# Patient Record
Sex: Male | Born: 1980
Health system: Southern US, Community
[De-identification: ages and names within clinical notes are randomized; demographics above are authoritative.]

## PROBLEM LIST (undated history)

## (undated) DIAGNOSIS — I509 Heart failure, unspecified: Secondary | ICD-10-CM

## (undated) DIAGNOSIS — A0472 Enterocolitis due to Clostridium difficile, not specified as recurrent: Secondary | ICD-10-CM

## (undated) DIAGNOSIS — Q203 Discordant ventriculoarterial connection: Secondary | ICD-10-CM

## (undated) DIAGNOSIS — I10 Essential (primary) hypertension: Secondary | ICD-10-CM

## (undated) DIAGNOSIS — I209 Angina pectoris, unspecified: Secondary | ICD-10-CM

## (undated) HISTORY — DX: Essential (primary) hypertension: I10

## (undated) HISTORY — PX: CORONARY ARTERY BYPASS GRAFT: SHX141

## (undated) HISTORY — PX: IMPLANTABLE CARDIOVERTER DEFIBRILLATOR IMPLANT: SHX5860

---

## 2002-03-19 ENCOUNTER — Encounter: Payer: Self-pay | Admitting: Family Medicine

## 2002-03-19 ENCOUNTER — Ambulatory Visit (HOSPITAL_COMMUNITY): Admission: RE | Admit: 2002-03-19 | Discharge: 2002-03-19 | Payer: Self-pay | Admitting: Family Medicine

## 2002-04-04 ENCOUNTER — Encounter: Payer: Self-pay | Admitting: Emergency Medicine

## 2002-04-04 ENCOUNTER — Emergency Department (HOSPITAL_COMMUNITY): Admission: EM | Admit: 2002-04-04 | Discharge: 2002-04-04 | Payer: Self-pay | Admitting: Emergency Medicine

## 2002-04-28 ENCOUNTER — Ambulatory Visit (HOSPITAL_COMMUNITY): Admission: RE | Admit: 2002-04-28 | Discharge: 2002-04-28 | Payer: Self-pay | Admitting: Family Medicine

## 2007-10-16 ENCOUNTER — Ambulatory Visit (HOSPITAL_COMMUNITY): Admission: RE | Admit: 2007-10-16 | Discharge: 2007-10-16 | Payer: Self-pay | Admitting: Surgery

## 2008-10-07 ENCOUNTER — Ambulatory Visit (HOSPITAL_COMMUNITY): Admission: RE | Admit: 2008-10-07 | Discharge: 2008-10-07 | Payer: Self-pay | Admitting: Family Medicine

## 2010-12-27 NOTE — Op Note (Signed)
NAME:  Jeremy Goodman, Jeremy Goodman                 ACCOUNT NO.:  [id number]   MEDICAL RECORD NO.:  [id number]          PATIENT TYPE:  AMB   LOCATION:  DAY                          FACILITY:  [facility]   PHYSICIAN:  Thomas A. Cornett, M.D.DATE OF BIRTH:  1980-10-01   DATE OF PROCEDURE:  10/16/2007  DATE OF DISCHARGE:                               OPERATIVE REPORT   PREOPERATIVE DIAGNOSIS:  Large right inguinal scrotal hernia.   POSTOPERATIVE DIAGNOSIS:  Large right inguinal scrotal hernia.   PROCEDURE:  Repair of large right inguinal scrotal hernia with mesh.   SURGEON:  Harriette Bouillon, M.D.   ASSISTANT:  OR staff.   ESTIMATED BLOOD LOSS:  About 30 mL.   DRAINS:  None.   SPECIMENS:  None.   INDICATIONS FOR PROCEDURE:  The patient is a 30 year old male who has a  history of complex cardiac congenital disease.  He has developed a right  inguinal scrotal hernia which was quite large and he wishes repaired due  to increasing symptoms.  He presents today for repair of this.  Risks  were discussed as well as alternative therapies.   DESCRIPTION OF PROCEDURE:  The patient brought to the operating room and  placed spine.  After induction of general anesthesia, the right inguinal  region was prepped and draped in sterile fashion.  Infiltration of local  anesthesia in the right inguinal crease was done and then a oblique  incision was made.  He was extremely thick with adipose tissue in this  area.  We dissected down initially and opened the external oblique.  It  was very difficult to see and initially we opened what appeared to be  the cord structures and hernia sac.  I was able to dissect the hernia  sac away from the cord structures but the external oblique was very  attenuated and very weak in this area.  It was a very difficult  dissection.  Once I was able to get around the cord structures, I was  able to dissect the very large indirect sac off the cord structures.  This extended all the way down  in the scrotum and we actually had to  pull the testicle up into the field to get the sac off of it.  He had  also a small hydrocele that I just opened and then removed all of the  sac that was overlying the testicle.  I was then able to dissect the  remainder of the sac all the way back to the internal ring once I did  that.  I then had to extend my incision on the external oblique more  superiorly to have adequate space to lay mesh.  Of note, the external  oblique was very attenuated due this longstanding chronic large hernia.  I elected to divide the ilioinguinal nerve due to the fact that it was  going to be lying adjacent to the mesh.  I was careful to identify the  iliohypogastric nerves and try to get these out of our way so we could  place stitches into the internal oblique and conjoined tendon  which  appeared to be intact.  Once I got the entire sac dissected off, I  reduced it back in the preperitoneal space.  I initially used a large  Prolene hernia system polypropylene mesh.  This did not lay very well,  was not big enough so I removed it and exchanged it for extended Prolene  hernia system.  The inner leaflet was placed in the prepared space by  the cord where the sac was reduced.  The onlay was placed around the  cord, the slit cut for the cord structures to exit it.  I secured it to  the pubic tubercle with a 0-0 Vicryl.  Also placed a 0-0 Vicryl to the  connecting piece so it was connected over to the conjoined tendon.  We  then closed the mesh around the cord with the #1 Novofil.  I put  interrupted Novofils around the mesh to secure it to the shelving edge  of the inguinal ligament as well as the conjoined tendon.  I put a  couple stitches superiorly with care taken not to get anywhere near the  ilioinguinal nerve remnant.  This helped to lay the mesh flatter.  I  felt it was made a better repair.  I then opened the new internal ring  that the mesh created around the  cord, so the tip my fifth digit could  freely fit through there easily and the cord actually moved to-and-fro  and I did not feel there was any impingement upon the cord.  Irrigation  was used and suctioned out.  The mesh laid up against the  iliohypogastric nerves, but these appeared to be intact with no evidence  of the nerve being trapped around the mesh though.  I did not want to  divide these.  The superficial femoral nerves were stretched quite a bit  during the case it looked like, but it did not appear these were divided  but there was significant stress in trying  to dissect the sac away from  these structures.  Irrigation was used and suctioned out.  I closed the  external oblique aponeurosis with a running 2-0 Vicryl.  I then used 3-0  Vicryl to reapproximate Scarpa's fascia.  4-0 Monocryl was used to close  the skin.  Dermabond was used as a dressing.  All final counts of  sponge, needle and instruments were found be correct this portion of the  case.  The patient was then extubated, taken to recovery in satisfactory  condition.      Thomas A. Cornett, M.D.  Electronically Signed     TAC/MEDQ  D:  10/16/2007  T:  10/16/2007  Job:  [id number]   cc:   Dr. Oswald Hillock, [facility]   Laverle Hobby, M.D.  [address]  [place], [place] [address]

## 2011-05-05 LAB — CBC
HCT: 45.2
Hemoglobin: 15.9
MCHC: 35.1
MCV: 91.8
Platelets: 311
RBC: 4.92
RDW: 12.7
WBC: 12.5 — ABNORMAL HIGH

## 2011-05-05 LAB — BASIC METABOLIC PANEL
BUN: 25 — ABNORMAL HIGH
CO2: 26
Calcium: 9.7
Chloride: 102
Creatinine, Ser: 1.1
GFR calc Af Amer: >60
GFR calc non Af Amer: >60
Glucose, Bld: 103 — ABNORMAL HIGH
Potassium: 4
Sodium: 137

## 2011-05-05 LAB — DIFFERENTIAL
Basophils Relative: 0
Eosinophils Absolute: 0.1
Monocytes Absolute: 0.9
Monocytes Relative: 7

## 2011-05-15 DIAGNOSIS — Z9889 Other specified postprocedural states: Secondary | ICD-10-CM | POA: Insufficient documentation

## 2011-05-15 DIAGNOSIS — Q203 Discordant ventriculoarterial connection: Secondary | ICD-10-CM | POA: Insufficient documentation

## 2011-09-22 DIAGNOSIS — J069 Acute upper respiratory infection, unspecified: Secondary | ICD-10-CM | POA: Diagnosis not present

## 2012-06-20 DIAGNOSIS — Z23 Encounter for immunization: Secondary | ICD-10-CM | POA: Diagnosis not present

## 2012-07-19 DIAGNOSIS — J069 Acute upper respiratory infection, unspecified: Secondary | ICD-10-CM | POA: Diagnosis not present

## 2012-08-22 DIAGNOSIS — R197 Diarrhea, unspecified: Secondary | ICD-10-CM | POA: Diagnosis not present

## 2012-09-02 ENCOUNTER — Encounter (HOSPITAL_COMMUNITY): Payer: Self-pay | Admitting: *Deleted

## 2012-09-02 ENCOUNTER — Inpatient Hospital Stay (HOSPITAL_COMMUNITY)
Admission: EM | Admit: 2012-09-02 | Discharge: 2012-09-03 | DRG: 308 | Disposition: A | Discharge status: Other Acute Inpatient Hospital | Payer: Medicare Other | Source: Other Acute Inpatient Hospital | Attending: Cardiology | Admitting: Cardiology

## 2012-09-02 ENCOUNTER — Other Ambulatory Visit: Payer: Self-pay | Admitting: Physician Assistant

## 2012-09-02 DIAGNOSIS — R0989 Other specified symptoms and signs involving the circulatory and respiratory systems: Secondary | ICD-10-CM | POA: Diagnosis not present

## 2012-09-02 DIAGNOSIS — Q248 Other specified congenital malformations of heart: Secondary | ICD-10-CM

## 2012-09-02 DIAGNOSIS — J90 Pleural effusion, not elsewhere classified: Secondary | ICD-10-CM | POA: Diagnosis not present

## 2012-09-02 DIAGNOSIS — R61 Generalized hyperhidrosis: Secondary | ICD-10-CM | POA: Diagnosis not present

## 2012-09-02 DIAGNOSIS — R570 Cardiogenic shock: Secondary | ICD-10-CM

## 2012-09-02 DIAGNOSIS — I471 Supraventricular tachycardia, unspecified: Secondary | ICD-10-CM

## 2012-09-02 DIAGNOSIS — I509 Heart failure, unspecified: Secondary | ICD-10-CM | POA: Diagnosis present

## 2012-09-02 DIAGNOSIS — J9600 Acute respiratory failure, unspecified whether with hypoxia or hypercapnia: Secondary | ICD-10-CM | POA: Diagnosis present

## 2012-09-02 DIAGNOSIS — N179 Acute kidney failure, unspecified: Secondary | ICD-10-CM | POA: Diagnosis not present

## 2012-09-02 DIAGNOSIS — Z9861 Coronary angioplasty status: Secondary | ICD-10-CM | POA: Diagnosis not present

## 2012-09-02 DIAGNOSIS — J189 Pneumonia, unspecified organism: Secondary | ICD-10-CM | POA: Diagnosis not present

## 2012-09-02 DIAGNOSIS — R109 Unspecified abdominal pain: Secondary | ICD-10-CM | POA: Diagnosis not present

## 2012-09-02 DIAGNOSIS — Z79899 Other long term (current) drug therapy: Secondary | ICD-10-CM | POA: Diagnosis not present

## 2012-09-02 DIAGNOSIS — G9340 Encephalopathy, unspecified: Secondary | ICD-10-CM

## 2012-09-02 DIAGNOSIS — I4892 Unspecified atrial flutter: Principal | ICD-10-CM | POA: Diagnosis present

## 2012-09-02 DIAGNOSIS — R42 Dizziness and giddiness: Secondary | ICD-10-CM | POA: Diagnosis not present

## 2012-09-02 DIAGNOSIS — Z9889 Other specified postprocedural states: Secondary | ICD-10-CM | POA: Diagnosis not present

## 2012-09-02 DIAGNOSIS — R002 Palpitations: Secondary | ICD-10-CM | POA: Diagnosis not present

## 2012-09-02 DIAGNOSIS — R0902 Hypoxemia: Secondary | ICD-10-CM | POA: Diagnosis not present

## 2012-09-02 DIAGNOSIS — Z7682 Awaiting organ transplant status: Secondary | ICD-10-CM | POA: Diagnosis not present

## 2012-09-02 DIAGNOSIS — I5023 Acute on chronic systolic (congestive) heart failure: Secondary | ICD-10-CM | POA: Diagnosis not present

## 2012-09-02 DIAGNOSIS — I498 Other specified cardiac arrhythmias: Secondary | ICD-10-CM | POA: Diagnosis not present

## 2012-09-02 DIAGNOSIS — J810 Acute pulmonary edema: Secondary | ICD-10-CM | POA: Diagnosis not present

## 2012-09-02 DIAGNOSIS — J811 Chronic pulmonary edema: Secondary | ICD-10-CM | POA: Diagnosis present

## 2012-09-02 DIAGNOSIS — Q205 Discordant atrioventricular connection: Secondary | ICD-10-CM | POA: Diagnosis not present

## 2012-09-02 DIAGNOSIS — G9349 Other encephalopathy: Secondary | ICD-10-CM | POA: Diagnosis not present

## 2012-09-02 DIAGNOSIS — Z7982 Long term (current) use of aspirin: Secondary | ICD-10-CM | POA: Diagnosis not present

## 2012-09-02 DIAGNOSIS — I4891 Unspecified atrial fibrillation: Secondary | ICD-10-CM | POA: Diagnosis not present

## 2012-09-02 DIAGNOSIS — R5381 Other malaise: Secondary | ICD-10-CM | POA: Diagnosis not present

## 2012-09-02 DIAGNOSIS — Z4682 Encounter for fitting and adjustment of non-vascular catheter: Secondary | ICD-10-CM | POA: Diagnosis not present

## 2012-09-02 DIAGNOSIS — Q203 Discordant ventriculoarterial connection: Secondary | ICD-10-CM | POA: Diagnosis not present

## 2012-09-02 DIAGNOSIS — I959 Hypotension, unspecified: Secondary | ICD-10-CM | POA: Diagnosis not present

## 2012-09-02 HISTORY — DX: Heart failure, unspecified: I50.9

## 2012-09-02 HISTORY — DX: Angina pectoris, unspecified: I20.9

## 2012-09-02 HISTORY — DX: Enterocolitis due to Clostridium difficile, not specified as recurrent: A04.72

## 2012-09-02 HISTORY — DX: Discordant ventriculoarterial connection: Q20.3

## 2012-09-02 LAB — CBC
Hemoglobin: 15 g/dL (ref 13.0–17.0)
MCH: 31.4 pg (ref 26.0–34.0)
MCHC: 33.8 g/dL (ref 30.0–36.0)
Platelets: 209 10*3/uL (ref 150–400)
RBC: 4.78 MIL/uL (ref 4.22–5.81)

## 2012-09-02 LAB — CREATININE, SERUM
Creatinine, Ser: 0.95 mg/dL (ref 0.50–1.35)
GFR calc non Af Amer: >90 mL/min (ref >90)

## 2012-09-02 MED ORDER — ONDANSETRON HCL 4 MG/2ML IJ SOLN: 4.0000 mg | Freq: Four times a day (QID) | INTRAMUSCULAR | Status: DC | PRN

## 2012-09-02 MED ORDER — ENOXAPARIN SODIUM 40 MG/0.4ML ~~LOC~~ SOLN
40.0000 mg | SUBCUTANEOUS | Status: DC
  Administered 2012-09-02: 40 mg via SUBCUTANEOUS
  Filled 2012-09-02 (×2): qty 0.4

## 2012-09-02 MED ORDER — SODIUM CHLORIDE 0.9 % IV SOLN
INTRAVENOUS | Status: DC
  Administered 2012-09-02 (×2): via INTRAVENOUS

## 2012-09-02 MED ORDER — AMIODARONE HCL IN DEXTROSE 360-4.14 MG/200ML-% IV SOLN
60.0000 mg/h | INTRAVENOUS | Status: DC
  Administered 2012-09-02 – 2012-09-03 (×3): 60 mg/h via INTRAVENOUS
  Filled 2012-09-02 (×8): qty 200

## 2012-09-02 MED ORDER — ASPIRIN EC 81 MG PO TBEC
81.0000 mg | DELAYED_RELEASE_TABLET | Freq: Every day | ORAL | Status: DC
  Administered 2012-09-03: 81 mg via ORAL
  Filled 2012-09-02: qty 1

## 2012-09-02 MED ORDER — ACETAMINOPHEN 325 MG PO TABS: 650.0000 mg | ORAL_TABLET | ORAL | Status: DC | PRN

## 2012-09-02 MED ORDER — NITROGLYCERIN 0.4 MG SL SUBL: 0.4000 mg | SUBLINGUAL_TABLET | SUBLINGUAL | Status: DC | PRN

## 2012-09-02 NOTE — H&P (Signed)
NAME:  Jeremy Goodman, Jeremy Goodman ROOM:   UNIT NUMBER:  [id number] LOCATION: ER ADM/VISIT DATE:  09/02/2012   ADM Jeremy Goodman:  [id number] DOB: 09-08-1980   PRIMARY CARDIOLOGIST:  Dr. Oswald Hillock, [facility].  REFERRING PHYSICIAN:  Dr. Sharee Pimple, [facility] ED.  REASON FOR CONSULTATION:  SVT.  HISTORY OF PRESENT ILLNESS:  Jeremy Goodman is a 32 year old male, with history of congenital heart disease, followed at [facility], who presented to the emergency room early this morning with complaint of abdominal pain.  Although he denied any frank palpitations, he did complain of feeling "shaky" all over, and particularly in his left lower abdomen.  He denied any chest pain, but did report diaphoresis.  The patient was noted to be in an SVT rhythm at approximately 170 BPM, and was treated with 20 mg IV diltiazem bolus, followed by a 150 mg IV amiodarone bolus.  He also presented with relative hypotension, 85/66, and received a total of 1 L of normal saline. Mentally alert, not otherwise in distress.  The patient remains hemodynamically stable and denies any palpitations.  Of note, he refers to having been diagnosed with "atrial fibrillation," and reports occasional palpitations, most recently a week ago.  He states that these episodes are very brief in duration, with no significant associated symptoms.  The patient also reportedly was diagnosed with "congestive heart failure in 2003," the last time that he was hospitalized. Reports that he was on the heart transplantation list at [facility] at one point.  Also, the patient states that he was diagnosed with C difficile approximately 2 weeks ago, and treated empirically with Flagyl.  He just completed his 2-week course yesterday.  ALLERGIES:  LEVAQUIN.  HOME MEDICATIONS: 1. Loratadine 1 tablet daily. 2. Aspirin 81 mg daily. 3. Coreg CR 20 mg daily. 4. Benadryl 25 mg at bedtime p.r.n. 5. Vasotec 10 mg b.i.d. 6. Nexium 40 mg  daily. 7. Lasix 20 mg b.i.d. 8. Mylicon 125 mg q.6 hours p.r.n. 9. Aldactone 25 mg daily.  PAST MEDICAL HISTORY: 1. Congenital heart disease. A. Transposition of great vessels. B. Status post surgery at ages 37 months and 7 years. 2. Congestive heart failure, 2003. 3. Atrial fibrillation. 4. Status post shunt.  SOCIAL HISTORY:  The patient denies any history of tobacco or alcohol use.  He is on total disability.  FAMILY HISTORY:  Father age 38, history of atrial fibrillation; status post recent pacemaker implantation.  REVIEW OF SYSTEMS:  Denies history of diabetes mellitus, HTN.  Denies any recent orthopnea, PND, lower extremity edema.  Has lost 17 pounds with adjustment of his diet.  Denies any recent exertional dyspnea or history of exertional angina.  Had recent diarrhea, since resolved.  Remaining systems reviewed are negative.  PHYSICAL EXAMINATION:  Vital signs:  Blood pressure currently 88/56, with range of 76-90 systolic.  Pulse currently in the 140s.  Respirations 23.  Temperature afebrile.  Sats 96% on 2 L.  Weight 270 pounds.  General:  A 32 year old male, moderately obese, lying supine, in no distress.  HEENT:  Normocephalic, atraumatic, PERRLA, EOMI.  Neck:  Palpable carotid pulses without bruits; no obvious JVD at 30 degrees.  Lungs:  Diminished breath sounds on the left, but clear on the right.  Heart:  Regular rhythm at increased rate.  No significant murmurs.  No S3 gallop.  Abdomen:  Soft, intact bowel sounds.  Extremities:  Palpable dorsalis pedis pulses.  No significant peripheral edema.  Skin:  Warm and  dry.  Musculoskeletal:  No obvious deformity.  Neuro:  Alert and oriented.  ADMISSION CHEST X-RAY:  Probable bibasilar atelectasis.  ADMISSION EKG:  SVT at 170 BPM; normal axis; approximate 1 mm ST depression in leads V5-V6.  LABORATORY DATA:  Troponin I 0.01.  BNP 105.  Total protein 9.1, AST 47, ALT 46.  Urinalysis:  30 protein, otherwise negative.  Sodium 133,  potassium 4.4.  BUN 16, creatinine 1.2, glucose 121.  TSH 4.8.  WBC 11,600, hemoglobin 16, hematocrit 51, and platelet 255,000.  IMPRESSION: 1. Supraventricular tachycardia. A. Question paroxysmal atrial fibrillation with rapid ventricular response versus ectopic atrial tachycardia. B. Status post treatment with IV diltiazem and IV amiodarone. 2. Hypotension. 3. Congenital heart disease. A. Transposition of great vessels. B. Status post multiple surgeries. 4. History of congestive heart failure. 5. Status post recent diarrhea. A. Empirically treated for Clostridium difficile.  PLAN:  Arrangements have been made for patient to be transferred directly to the intensive care unit at [facility], for continued close monitoring and management by our team, also EP consultation.  Initial contact was made by the ER to patient's primary cardiology service at [facility], however it was explained that there were no beds available for transfer of  the patient. We were asked to assist with his care at that time. As noted, patient has already received a total of 25 mg IV diltiazem bolus, followed by a 150 mg bolus of amiodarone.  He is being started on Amiodarone infusion in the ER prior to transfer.  Patient seen and examined in conjunction with Dr. Diona Browner.   __________________________    Rozell Searing, P.A. Alinda Money D: 09/02/2012 1658 T: 09/02/2012 1714 P: SER2   Attending note:  Patient seen and examined. No records currently available on this complicated patient, followed by Dr. Elyse Hsu at [facility] with reported history of transposition of the great vessels status post surgical repair at less than 6 months old, apparent congestive heart failure symptoms previously on the transplant list at [facility] however clinically stable for quite some time, and vague history of possible atrial arrhythmias although does not appear to have been on anticoagulant therapy or antiarrhythmics over time. He presents  with symptoms noted above, found to be in what appears to be an atrial tachycardia, fairly regular at this time. He has been relatively hypotensive, however clinically stable with normal mental status and not in any distress to require urgent cardioversion. He is being initiated on amiodarone for rate/rhythm control. Initial contact was made by the [facility] ER to the cardiology service at [facility], however informed that there were no beds for transfer of patient. We were therefore involved to assist with care the patient, transfer now arranged to [facility] ICU, accepting physician is Dr. Jens Som.  On examination patient sitting in bed in no acute distress, blood pressure systolic 95, heart rate in the 140s and regular. Obese male with diminished breath sounds, no rales, distant regular heart sounds with indistinct PMI.  Laboratory data notable for a WBC 11.6, hemoglobin 16, platelets 255, potassium 4.4, BUN 16, creatinine 1.2, troponin I 0.01, BNP 105. Chest x-ray reports probable bibasilar atelectasis. ECGs reviewed and being sent with the patient.  We are holding for the time being enalapril, Lasix, Coreg, and spironolactone in light of relatively low blood pressures. Patient will need to have EP consultation, also concurrent management by the Advanced heart failure team to interface with the patient's primary cardiology team at [facility].  Jonelle Sidle, M.D.,  F.A.C.C.

## 2012-09-02 NOTE — Progress Notes (Signed)
Pt received from care link via stretcher in no apparent distress, vss, heart in 130-140, no c/o of pain, pt oriented to room, call bell button, bed in lowest position, md notified of pt arrival, will continue to monitor.

## 2012-09-02 NOTE — Progress Notes (Signed)
Patient ID: Jeremy Goodman, male   DOB: Nov 16, 1980, 32 y.o.   MRN: [id number]   Patient was admitted from [facility] with supraventricular tachycardia. The patient says that he thought he was somewhat dehydrated and he is receiving IV fluid. He received a bolus of amiodarone at [facility]. He is now on a drip. Blood pressure is 87/56. The patient feels fine. He can not feel his current arrhythmia. The rate is 135. I'm hesitant to give him Cardizem at this time. His blood pressure dropped to 70 systolic earlier today with one dose of Cardizem. I have considered giving him another bolus of amiodarone. I have chosen not to at this time because of his blood pressure. The plan will be for him to receive IV fluids over the next few hours. If his heart rate remains elevated I would be in favor of giving him another bolus of IV amiodarone. Clinically he looks fine.  Jerral Bonito, MD

## 2012-09-03 ENCOUNTER — Observation Stay (HOSPITAL_COMMUNITY): Payer: Medicare Other

## 2012-09-03 ENCOUNTER — Encounter (HOSPITAL_COMMUNITY): Payer: Self-pay | Admitting: Internal Medicine

## 2012-09-03 DIAGNOSIS — R1312 Dysphagia, oropharyngeal phase: Secondary | ICD-10-CM | POA: Diagnosis not present

## 2012-09-03 DIAGNOSIS — N179 Acute kidney failure, unspecified: Secondary | ICD-10-CM | POA: Diagnosis not present

## 2012-09-03 DIAGNOSIS — I471 Supraventricular tachycardia: Secondary | ICD-10-CM

## 2012-09-03 DIAGNOSIS — R918 Other nonspecific abnormal finding of lung field: Secondary | ICD-10-CM | POA: Diagnosis not present

## 2012-09-03 DIAGNOSIS — J810 Acute pulmonary edema: Secondary | ICD-10-CM | POA: Diagnosis not present

## 2012-09-03 DIAGNOSIS — I4892 Unspecified atrial flutter: Principal | ICD-10-CM

## 2012-09-03 DIAGNOSIS — J189 Pneumonia, unspecified organism: Secondary | ICD-10-CM | POA: Diagnosis present

## 2012-09-03 DIAGNOSIS — R570 Cardiogenic shock: Secondary | ICD-10-CM | POA: Diagnosis present

## 2012-09-03 DIAGNOSIS — Q203 Discordant ventriculoarterial connection: Secondary | ICD-10-CM | POA: Diagnosis not present

## 2012-09-03 DIAGNOSIS — Z7982 Long term (current) use of aspirin: Secondary | ICD-10-CM | POA: Diagnosis not present

## 2012-09-03 DIAGNOSIS — I5023 Acute on chronic systolic (congestive) heart failure: Secondary | ICD-10-CM | POA: Diagnosis present

## 2012-09-03 DIAGNOSIS — R0989 Other specified symptoms and signs involving the circulatory and respiratory systems: Secondary | ICD-10-CM | POA: Diagnosis not present

## 2012-09-03 DIAGNOSIS — I498 Other specified cardiac arrhythmias: Secondary | ICD-10-CM

## 2012-09-03 DIAGNOSIS — Z4682 Encounter for fitting and adjustment of non-vascular catheter: Secondary | ICD-10-CM | POA: Diagnosis not present

## 2012-09-03 DIAGNOSIS — I509 Heart failure, unspecified: Secondary | ICD-10-CM | POA: Diagnosis not present

## 2012-09-03 DIAGNOSIS — Z9889 Other specified postprocedural states: Secondary | ICD-10-CM | POA: Diagnosis not present

## 2012-09-03 DIAGNOSIS — Q205 Discordant atrioventricular connection: Secondary | ICD-10-CM | POA: Diagnosis not present

## 2012-09-03 DIAGNOSIS — J9600 Acute respiratory failure, unspecified whether with hypoxia or hypercapnia: Secondary | ICD-10-CM | POA: Diagnosis not present

## 2012-09-03 DIAGNOSIS — J811 Chronic pulmonary edema: Secondary | ICD-10-CM | POA: Diagnosis present

## 2012-09-03 DIAGNOSIS — I517 Cardiomegaly: Secondary | ICD-10-CM | POA: Diagnosis not present

## 2012-09-03 DIAGNOSIS — G9340 Encephalopathy, unspecified: Secondary | ICD-10-CM

## 2012-09-03 DIAGNOSIS — Z79899 Other long term (current) drug therapy: Secondary | ICD-10-CM | POA: Diagnosis not present

## 2012-09-03 LAB — URINALYSIS, ROUTINE W REFLEX MICROSCOPIC
Bilirubin Urine: NEGATIVE
Ketones, ur: NEGATIVE mg/dL
Leukocytes, UA: NEGATIVE
Nitrite: NEGATIVE
Protein, ur: NEGATIVE mg/dL
pH: 5 (ref 5.0–8.0)

## 2012-09-03 LAB — GLUCOSE, CAPILLARY: Glucose-Capillary: 164 mg/dL — ABNORMAL HIGH (ref 70–99)

## 2012-09-03 LAB — POCT I-STAT 3, ART BLOOD GAS (G3+)
Acid-base deficit: 5 mmol/L — ABNORMAL HIGH (ref 0.0–2.0)
Acid-base deficit: 2 mmol/L (ref 0.0–2.0)
O2 Saturation: 94 %
O2 Saturation: 99 %
Patient temperature: 99.4
TCO2: 24 mmol/L (ref 0–100)
TCO2: 23 mmol/L (ref 0–100)
pCO2 arterial: 45.4 mmHg — ABNORMAL HIGH (ref 35.0–45.0)
pCO2 arterial: 34.2 mmHg — ABNORMAL LOW (ref 35.0–45.0)

## 2012-09-03 LAB — COMPREHENSIVE METABOLIC PANEL
ALT: 36 U/L (ref 0–53)
AST: 35 U/L (ref 0–37)
Albumin: 4 g/dL (ref 3.5–5.2)
CO2: 21 mEq/L (ref 19–32)
Calcium: 9.1 mg/dL (ref 8.4–10.5)
Creatinine, Ser: 1.45 mg/dL — ABNORMAL HIGH (ref 0.50–1.35)
Sodium: 131 mEq/L — ABNORMAL LOW (ref 135–145)

## 2012-09-03 LAB — PROCALCITONIN: Procalcitonin: 2.15 ng/mL

## 2012-09-03 LAB — CBC
MCH: 31.7 pg (ref 26.0–34.0)
MCV: 94.5 fL (ref 78.0–100.0)
Platelets: ADEQUATE 10*3/uL (ref 150–400)
RBC: 5.43 MIL/uL (ref 4.22–5.81)
RDW: 13.2 % (ref 11.5–15.5)
WBC: 24.8 10*3/uL — ABNORMAL HIGH (ref 4.0–10.5)

## 2012-09-03 LAB — INFLUENZA PANEL BY PCR (TYPE A & B): Influenza B By PCR: NEGATIVE

## 2012-09-03 MED ORDER — ZOLPIDEM TARTRATE 5 MG PO TABS: 5.0000 mg | ORAL_TABLET | Freq: Every evening | ORAL | Status: DC | PRN

## 2012-09-03 MED ORDER — INSULIN ASPART 100 UNIT/ML ~~LOC~~ SOLN
1.0000 [IU] | SUBCUTANEOUS | Status: DC
  Administered 2012-09-03: 2 [IU] via SUBCUTANEOUS

## 2012-09-03 MED ORDER — ADULT MULTIVITAMIN LIQUID CH: 5.0000 mL | Freq: Every day | ORAL | Status: DC

## 2012-09-03 MED ORDER — MIDAZOLAM HCL 2 MG/2ML IJ SOLN
INTRAMUSCULAR | Status: AC
  Administered 2012-09-03: 2 mg
  Filled 2012-09-03: qty 2

## 2012-09-03 MED ORDER — CHLORHEXIDINE GLUCONATE 0.12 % MT SOLN
15.0000 mL | Freq: Two times a day (BID) | OROMUCOSAL | Status: DC
  Administered 2012-09-03: 15 mL via OROMUCOSAL
  Filled 2012-09-03: qty 15

## 2012-09-03 MED ORDER — SODIUM CHLORIDE 0.9 % IV SOLN: 1.0000 mg/h | INTRAVENOUS | Status: DC

## 2012-09-03 MED ORDER — ROCURONIUM BROMIDE 50 MG/5ML IV SOLN
INTRAVENOUS | Status: AC
  Filled 2012-09-03: qty 2

## 2012-09-03 MED ORDER — OSMOLITE 1.5 CAL PO LIQD
1000.0000 mL | ORAL | Status: DC
  Filled 2012-09-03 (×2): qty 1000

## 2012-09-03 MED ORDER — ADENOSINE 6 MG/2ML IV SOLN
INTRAVENOUS | Status: AC
  Administered 2012-09-03: 6 mg
  Filled 2012-09-03: qty 4

## 2012-09-03 MED ORDER — ACETAMINOPHEN 325 MG PO TABS: 650.0000 mg | ORAL_TABLET | ORAL | Status: DC | PRN

## 2012-09-03 MED ORDER — VANCOMYCIN HCL 10 G IV SOLR: 1250.0000 mg | Freq: Two times a day (BID) | INTRAVENOUS | Status: DC

## 2012-09-03 MED ORDER — ENOXAPARIN SODIUM 40 MG/0.4ML ~~LOC~~ SOLN: 40.0000 mg | SUBCUTANEOUS | Status: DC

## 2012-09-03 MED ORDER — MORPHINE SULFATE 2 MG/ML IJ SOLN
INTRAMUSCULAR | Status: AC
  Administered 2012-09-03: 2 mg via INTRAVENOUS
  Filled 2012-09-03: qty 1

## 2012-09-03 MED ORDER — CHLORHEXIDINE GLUCONATE 0.12 % MT SOLN: 15.0000 mL | Freq: Two times a day (BID) | OROMUCOSAL | Status: DC

## 2012-09-03 MED ORDER — ADENOSINE 6 MG/2ML IV SOLN
INTRAVENOUS | Status: AC
  Administered 2012-09-03: 13:00:00
  Filled 2012-09-03: qty 2

## 2012-09-03 MED ORDER — HYDROCORTISONE SOD SUCCINATE 100 MG IJ SOLR
50.0000 mg | Freq: Four times a day (QID) | INTRAMUSCULAR | Status: DC
  Administered 2012-09-03: 50 mg via INTRAVENOUS
  Filled 2012-09-03 (×4): qty 1

## 2012-09-03 MED ORDER — BIOTENE DRY MOUTH MT LIQD: 1.0000 "application " | Freq: Four times a day (QID) | OROMUCOSAL | Status: DC

## 2012-09-03 MED ORDER — SODIUM CHLORIDE 0.9 % IV SOLN: 25.0000 ug/h | INTRAVENOUS | Status: DC

## 2012-09-03 MED ORDER — PANTOPRAZOLE SODIUM 40 MG IV SOLR
40.0000 mg | INTRAVENOUS | Status: DC
  Administered 2012-09-03: 40 mg via INTRAVENOUS
  Filled 2012-09-03 (×2): qty 40

## 2012-09-03 MED ORDER — ETOMIDATE 2 MG/ML IV SOLN
15.0000 mg | Freq: Once | INTRAVENOUS | Status: AC
  Administered 2012-09-03: 15 mg via INTRAVENOUS

## 2012-09-03 MED ORDER — SUCCINYLCHOLINE CHLORIDE 20 MG/ML IJ SOLN
INTRAMUSCULAR | Status: AC
  Filled 2012-09-03: qty 1

## 2012-09-03 MED ORDER — PANTOPRAZOLE SODIUM 40 MG IV SOLR: 40.0000 mg | INTRAVENOUS | Status: DC

## 2012-09-03 MED ORDER — ONDANSETRON HCL 4 MG/2ML IJ SOLN: 4.0000 mg | Freq: Four times a day (QID) | INTRAMUSCULAR | Status: DC | PRN

## 2012-09-03 MED ORDER — LIDOCAINE HCL (PF) 1 % IJ SOLN
INTRAMUSCULAR | Status: AC
  Administered 2012-09-03: 11:00:00
  Filled 2012-09-03: qty 5

## 2012-09-03 MED ORDER — FUROSEMIDE 10 MG/ML IJ SOLN
60.0000 mg | Freq: Once | INTRAMUSCULAR | Status: AC
  Administered 2012-09-03: 60 mg via INTRAVENOUS

## 2012-09-03 MED ORDER — VANCOMYCIN HCL 10 G IV SOLR
2500.0000 mg | Freq: Once | INTRAVENOUS | Status: AC
  Administered 2012-09-03: 2500 mg via INTRAVENOUS
  Filled 2012-09-03: qty 2500

## 2012-09-03 MED ORDER — SODIUM CHLORIDE 0.9 % IV SOLN
1250.0000 mg | Freq: Two times a day (BID) | INTRAVENOUS | Status: DC
  Filled 2012-09-03: qty 1250

## 2012-09-03 MED ORDER — DEXTROSE 5 % IV SOLN: 1.0000 g | Freq: Three times a day (TID) | INTRAVENOUS | Status: DC

## 2012-09-03 MED ORDER — DEXTROSE 5 % IV SOLN
500.0000 mg | INTRAVENOUS | Status: DC
  Administered 2012-09-03: 500 mg via INTRAVENOUS
  Filled 2012-09-03: qty 500

## 2012-09-03 MED ORDER — BIOTENE DRY MOUTH MT LIQD
1.0000 "application " | Freq: Four times a day (QID) | OROMUCOSAL | Status: DC
  Administered 2012-09-03: 15 mL via OROMUCOSAL

## 2012-09-03 MED ORDER — VASOPRESSIN 20 UNIT/ML IJ SOLN: 0.0300 [IU]/min | INTRAVENOUS | Status: DC

## 2012-09-03 MED ORDER — ADULT MULTIVITAMIN LIQUID CH
5.0000 mL | Freq: Every day | ORAL | Status: DC
  Filled 2012-09-03: qty 5

## 2012-09-03 MED ORDER — DEXTROSE 5 % IV SOLN
1.0000 g | Freq: Three times a day (TID) | INTRAVENOUS | Status: DC
  Administered 2012-09-03: 1 g via INTRAVENOUS
  Filled 2012-09-03 (×3): qty 1

## 2012-09-03 MED ORDER — INSULIN ASPART 100 UNIT/ML ~~LOC~~ SOLN: 1.0000 [IU] | SUBCUTANEOUS | Status: DC

## 2012-09-03 MED ORDER — VANCOMYCIN HCL 10 G IV SOLR: 2500.0000 mg | Freq: Once | INTRAVENOUS | Status: DC

## 2012-09-03 MED ORDER — FUROSEMIDE 10 MG/ML IJ SOLN
5.0000 mg/h | INTRAVENOUS | Status: DC
  Administered 2012-09-03: 4 mg/h via INTRAVENOUS
  Filled 2012-09-03 (×3): qty 25

## 2012-09-03 MED ORDER — VASOPRESSIN 20 UNIT/ML IJ SOLN
0.0300 [IU]/min | INTRAVENOUS | Status: DC
  Filled 2012-09-03: qty 2.5

## 2012-09-03 MED ORDER — FUROSEMIDE 10 MG/ML IJ SOLN
INTRAMUSCULAR | Status: AC
  Filled 2012-09-03: qty 8

## 2012-09-03 MED ORDER — DEXTROSE 5 % IV SOLN: 500.0000 mg | INTRAVENOUS | Status: DC

## 2012-09-03 MED ORDER — FENTANYL CITRATE 0.05 MG/ML IJ SOLN
INTRAMUSCULAR | Status: AC
  Administered 2012-09-03: 100 ug
  Filled 2012-09-03: qty 2

## 2012-09-03 MED ORDER — ALPRAZOLAM 0.25 MG PO TABS
0.2500 mg | ORAL_TABLET | Freq: Two times a day (BID) | ORAL | Status: DC | PRN
  Administered 2012-09-03: 0.25 mg via ORAL
  Filled 2012-09-03: qty 1

## 2012-09-03 MED ORDER — ETOMIDATE 2 MG/ML IV SOLN
INTRAVENOUS | Status: AC
  Filled 2012-09-03: qty 10

## 2012-09-03 MED ORDER — LIDOCAINE HCL (CARDIAC) 20 MG/ML IV SOLN
INTRAVENOUS | Status: AC
  Filled 2012-09-03: qty 5

## 2012-09-03 MED ORDER — SODIUM CHLORIDE 0.9 % IV SOLN
25.0000 ug/h | INTRAVENOUS | Status: DC
  Administered 2012-09-03: 50 ug/h via INTRAVENOUS
  Filled 2012-09-03: qty 50

## 2012-09-03 MED ORDER — HYDROCORTISONE SOD SUCCINATE 100 MG IJ SOLR: 50.0000 mg | Freq: Four times a day (QID) | INTRAMUSCULAR | Status: DC

## 2012-09-03 MED ORDER — SODIUM CHLORIDE 0.9 % IV SOLN: INTRAVENOUS | Status: DC

## 2012-09-03 MED ORDER — NOREPINEPHRINE BITARTRATE 1 MG/ML IJ SOLN
2.0000 ug/min | INTRAVENOUS | Status: DC
  Administered 2012-09-03: 10 ug/min via INTRAVENOUS
  Filled 2012-09-03 (×2): qty 16

## 2012-09-03 MED ORDER — NOREPINEPHRINE BITARTRATE 1 MG/ML IJ SOLN: 2.0000 ug/min | INTRAVENOUS | Status: DC

## 2012-09-03 MED ORDER — PRO-STAT SUGAR FREE PO LIQD
60.0000 mL | Freq: Four times a day (QID) | ORAL | Status: DC
  Filled 2012-09-03 (×3): qty 60

## 2012-09-03 MED ORDER — SODIUM CHLORIDE 0.9 % IV SOLN
1.0000 mg/h | INTRAVENOUS | Status: DC
  Administered 2012-09-03: 2 mg/h via INTRAVENOUS
  Filled 2012-09-03: qty 10

## 2012-09-03 MED ORDER — ETOMIDATE 2 MG/ML IV SOLN
INTRAVENOUS | Status: AC
  Administered 2012-09-03: 20 mg
  Filled 2012-09-03: qty 20

## 2012-09-03 MED ORDER — FUROSEMIDE 10 MG/ML IJ SOLN: 5.0000 mg/h | INTRAVENOUS | Status: DC

## 2012-09-03 NOTE — Progress Notes (Signed)
Lasix 60mg  IV given. nonrebreather placed on patient. O2 sat  92%

## 2012-09-03 NOTE — Progress Notes (Addendum)
Pt c/o of anxiety, SOB and cough. BBS with crackles. Productive cough with thick yellow pink sputum. O2 sat 87-88% on o2@4L   50% venti mask applied O2 sat up to 90%. Dr. Terressa Koyanagi paged.

## 2012-09-03 NOTE — Progress Notes (Signed)
Report given to receiving nurse, [place], at [facility]. PT going to room 7205.  Dawson Bills, RN

## 2012-09-03 NOTE — Procedures (Signed)
Name:  [name] MRN:  [id number] DOB:  02-07-1981  PROCEDURE NOTE  Procedure:  Ultrasound-guided central venous catheter placement.  Indications:  Need for intravenous access and hemodynamic monitoring.  Consent:  Consent was implied due to the emergency nature of the procedure.  Anesthesia:  A total of 10 mL of 1% Lidocaine was used for local infiltration anesthesia.  Procedure summary:  Appropriate equipment was assembled.  The patient was identified as Jeremy Goodman and safety timeout was performed. The patient was placed in Trendelenburg position.  Sterile technique was used. The patient's right groin region was prepped using chlorhexidine / alcohol scrub and the field was draped in usual sterile fashion with full body drape. The right femoral vein was identified by ultrasound, the patency was evaluated and images were documented. After the adequate anesthesia was achieved, the vein was cannulated with the introducer needle under sonographic guidance witout difficulty. A guide wire was advanced through the introducer needle, which was then withdrawn. A small skin incision was made at the point of wire entry, the dilator was inserted over the guide wire and appropriate dilation was obtained. The dilator was removed and triple-lumen catheter was advanced over the guide wire, which was then removed.  All ports were aspirated and flushed with normal saline without difficulty. The catheter was secured into place with sutures. Antibiotic patch was placed and sterile dressing was applied. Post-procedure chest x-ray was ordered.  Complications:  No immediate complications were noted.  Hemodynamic parameters and oxygenation remained stable throughout the procedure.  Estimated blood loss:  Less then 5 mL.  Orlean Bradford, M.D. Pulmonary and Critical Care Medicine [facility] Cell: [phone] Pager: [phone]  09/03/2012, 5:20 AM

## 2012-09-03 NOTE — Progress Notes (Addendum)
Pt continues to decline. Dr. Darrick Penna aware. Dr. Herma Carson to come to bedside. Morphine 2mg  IV given. Pt placed on bipap.

## 2012-09-03 NOTE — Progress Notes (Signed)
eLink Physician-Brief Progress Note Patient Name: Jeremy Goodman DOB: 1981-03-11 MRN: [id number]  Date of Service  09/03/2012   HPI/Events of Note  Call from nurse reporting resp distress in patient with h/o of transposition of great vessels/CHF admitted with C Diff and concern for hypotension requiring fluid bolus.  Now with crackles through on ventimask with sats of 85%.  Usually on lasix 20 mg bid.  Has fluids running at 150 cc/hr.   eICU Interventions  Plan: KVO IVFs PCXR ABG Lasix 60 mg IV now 100% NRB   Intervention Category Intermediate Interventions: Respiratory distress - evaluation and management  DETERDING,ELIZABETH 09/03/2012, 2:35 AM

## 2012-09-03 NOTE — Progress Notes (Signed)
Name: Jeremy Goodman MRN: [id number] DOB: [birth year] LOS: 1  PCCM RESIDENT DAILY PROGRESS NOTE  History of Present Illness: 32 yo with congenital heart disease (transposition of the great vessels s/p surgical repair) and CHF transferred on 1/20 form [facility] where he presented with CVT and hypotension. Developed acute pulmonary edema. Intubated. Hypotensive required vasopressors  Lines / Drains: R fem TLC 1/21 (no neck access) >>>  OETT 1/21 >>>  OGT 1/21 >>>  Foley 1/21 >>>  Cultures: None  Antibiotics: None  Tests / Events: 1/20 Transferred form [facility] with SVT / hypotension  1/21 Intubated for acute pulmonary edema. Requires vasopressors.  Overnight Events: Developed respiratory distress this morning and was intubated.  Pulmonary edema likely from his heart failure and SVT.  Rate control with amiodarone with ST in the 130s.  Requiring pressors.  Lasix drip 4 mg/hr.   Vital Signs: Temp:  [97.9 F (36.6 C)-98.6 F (37 C)] 97.9 F (36.6 C) (01/21 0000) Pulse Rate:  [58-140] 134  (01/21 0700) Resp:  [13-29] 24  (01/21 0700) BP: (66-137)/(22-96) 100/63 mmHg (01/21 0700) SpO2:  [87 %-99 %] 99 % (01/21 0700) FiO2 (%):  [100 %] 100 % (01/21 0430) Weight:  [274 lb 0.5 oz (124.3 kg)] 274 lb 0.5 oz (124.3 kg) (01/20 1831) I/O last 3 completed shifts: In: 1910.5 [P.O.:480; I.V.:1420.5; IV Piggyback:10] Out: 1050 [Urine:1050]  Physical Examination: Vitals reviewed. General: resting in bed, NAD, arousable and calm.  HEENT: PERRL, EOMI, no scleral icterus Cardiac: Tachycardic rate regular rhythm.  no rubs, murmurs or gallops Pulm: mild inspiratory crackles bilaterally with decreased breath sounds in the bilateral bases. no wheezes, rales, or rhonchi Abd: soft, nontender, nondistended, BS present Ext: warm and well perfused, no pedal edema Neuro: sedated, intubated, arousable, moving all 4.   Ventilator settings: Vent Mode:  [-] PRVC FiO2 (%):  [100 %] 100 % Set  Rate:  [24 bmp] 24 bmp Vt Set:  [600 mL] 600 mL PEEP:  [5 cmH20-10 cmH20] 10 cmH20 Plateau Pressure:  [39 cmH20] 39 cmH20  Basename 09/03/12 0546  PHART 7.297*  PO2ART 79.0*  TCO2 24  HCO3 22.2   Labs and Imaging:   Basic Metabolic Panel:  Lab 09/03/12 0865 09/02/12 2007  NA 131* --  K 4.6 --  CL 94* --  CO2 21 --  GLUCOSE 213* --  BUN 15 --  CREATININE 1.45* 0.95  CALCIUM 9.1 --  MG -- --  PHOS -- --   Liver Function Tests:  Lab 09/03/12 0500  AST 35  ALT 36  ALKPHOS 72  BILITOT 0.9  PROT 7.9  ALBUMIN 4.0   CBC:  Lab 09/03/12 0500 09/02/12 2007  WBC 24.8* 10.1  NEUTROABS -- --  HGB 17.2* 15.0  HCT 51.3 44.4  MCV 94.5 92.9  PLT PENDING 209   Assessment and Plan: PULMONARY  ASSESSMENT: 1. Acute respiratory failure secondary to acute pulmonary edema:  PLAN:   Full vent support.   Repeat ABG this AM and work to wean down O2 with peep to 10, recruit, with edema should recruit with peep Lasix gtt CXR in the AM May have goal to 7 cc/kg, after abg review Goal net neg  CARDIOVASCULAR  ASSESSMENT:  1. Acute exacerbation of chronic systolic heart failure:  Previous EF at last check was 30-35%.  Fluid overloaded on exam with pulmonary edema likely secondary to the SVT.  Currently on a Lasix drip because of decreased BP. 2. Cardiogenic shock:  Hypotensive and likely secondary  to #1.  On Levophed at 34 currently. Cards and EP following  3. SVT:  On amiodarone this for rate control because of hypotension requiring pressor support.  Converted to Sinus Tach with rates in the 120-130s.   4. Hx of transposition of the great vessels s/p MUSTARD procedure: Followed at [facility].  Apparently was on the transplant list at one time.  Unsure of current status on the list.   PLAN:  Continue Lasix drip and titrate to for UOP Continue amiodarone.  Could consider digoxin as well but will defer to Cards and EP ASA Hold home anti-hypertensives with Hypotension. 2D echo  pending Levophed to MAp goals, consider reduction levo by addition vaso Cortisol then stress steroids Role swan? I will place a line  RENAL  ASSESSMENT:   1. Acute Kidney injury:  Creatinine increased from admission likely with the hypotension and lasix drip.    PLAN:   Continue to trend Bmet to monitor diuresis. Lasix per cards, see hct as well, intravascular concentration?  GASTROINTESTINAL  ASSESSMENT:   1. NPO at this time: with intubation will look to initiate tube feedings likely this afternoon if there is no plan to extubate.   PLAN:   Consider TF early now ppi   HEMATOLOGIC  ASSESSMENT:   1. Leukocytosis: r/o PNA  PLAN:  Asa Lovenox, follow crt  INFECTIOUS  ASSESSMENT:  No active issues.  Increased WBC but no fever or obvious source of infection  PLAN:   Cultures as above.  Blood cultures x2 Follow CXR in am  Add empiric ABX, as high dose pressors, ceftaz, vanc, azithro Flu assay Pct algo to dc abx in 3 days  ENDOCRINE  ASSESSMENT:  Blood glucose on Bmet this AM was 200s. No history of DM R/o rel AI    PLAN:   Monitor CBG and add ICU hyperglycemia protocol.  Stress roids after cortisol sent NEUROLOGIC  ASSESSMENT:   1. Acute encephalopathy: Likely secondary to hypoxia from pulmonary edema and hypotension.  Improved currently now that he is intubated.  PLAN:   Fent/Versed gtt Daily WUA.  CLINICAL SUMMARY: 32 y.o found to be in acute pulmonary edema with acute respiratory failure intubated by critical care. Now intubated and on Lasix drip monitoring strict intake and output. He is hemodynamically unstable. Cardiology is the primary with Critical care now following.  Mother-[phone] (cell)  Father [phone]  Father [phone] (cell) Add empirc abx, until we understand etiology shock in full  Best practices / Disposition: -->ICU status under PCCM -->full code -->Heparin for DVT Px -->Protonix for GI Px -->ventilator  bundle -->diet -->family updated at bedside  PRIBULA,CHRISTOPHER, MD Internal Medicine Resident, PGY III Co-Chief Resident, Internal Medicine Pager: [phone] 09/03/2012 8:01 AM   The patient is critically ill with multiple organ systems failure and requires high complexity decision making for assessment and support, frequent evaluation and titration of therapies, application of advanced monitoring technologies and extensive interpretation of multiple databases. Critical Care Time devoted to patient care services described in this note is 50  Minutes. Including time to re image ETT, as concners from R for balloon herniation.  I have fully examined this patient and agree with above findings.    And edited in full  [name]. [name], MD, FACP Pgr: [phone] [facility]] \

## 2012-09-03 NOTE — Plan of Care (Signed)
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  He was given Amiodarone and IVFs for dehydration.  Unfortunately patient developed acute respiratory distress secondary to acute pulmonary edema.  He did not respond to initial Morphine, IV lasix and BiPAP and required intubation.  A/P Acute respiratory failure 2/2 volume overload in setting of atrial tachycardia --Continue Mechanical ventilation --Continue Amiodarone, will give another bolus --IV diuresis --TTE in am --Appreciate critical care assistance with patient.

## 2012-09-03 NOTE — Care Management Note (Signed)
    Page 1 of 1   09/03/2012     11:14:15 AM   CARE MANAGEMENT NOTE 09/03/2012  Patient:  Jeremy Goodman, Jeremy Goodman   Account Number:  [id number]  Date Initiated:  09/03/2012  Documentation initiated by:  Junius Creamer  Subjective/Objective Assessment:   adm w resp distress, on vent     Action/Plan:   lives w family   Anticipated DC Date:     Anticipated DC Plan:        DC Planning Services  CM consult      Choice offered to / List presented to:             Status of service:   Medicare Important Message given?   (If response is "NO", the following Medicare IM given date fields will be blank) Date Medicare IM given:   Date Additional Medicare IM given:    Discharge Disposition:    Per UR Regulation:  Reviewed for med. necessity/level of care/duration of stay  If discussed at Long Length of Stay Meetings, dates discussed:    Comments:  1/21 1112 debbie Nataliah Hatlestad rn,bsn

## 2012-09-03 NOTE — Progress Notes (Addendum)
ANTIBIOTIC CONSULT NOTE - INITIAL  Pharmacy Consult for vancomycin, fortaz Indication: ? Sepsis, r/o PNA  Allergies  Allergen Reactions  . Levaquin (Levofloxacin In D5w) Other (See Comments)    Hallucinations, and the medication affects his muscles.    Patient Measurements: Height: 5\' 11"  (180.3 cm) Weight: 274 lb 0.5 oz (124.3 kg) IBW/kg (Calculated) : 75.3   Vital Signs: Temp: 99.4 F (37.4 C) (01/21 0800) Temp src: Axillary (01/21 0800) BP: 104/60 mmHg (01/21 0900) Pulse Rate: 70  (01/21 0900) Intake/Output from previous day: 01/20 0701 - 01/21 0700 In: 1910.5 [P.O.:480; I.V.:1420.5; IV Piggyback:10] Out: 1050 [Urine:1050] Intake/Output from this shift: Total I/O In: 210.8 [I.V.:210.8] Out: 125 [Urine:125]  Labs:  [facility] 09/03/12 0500 09/02/12 2007  WBC 24.8* 10.1  HGB 17.2* 15.0  PLT PLATELET CLUMPS NOTED ON SMEAR, COUNT APPEARS ADEQUATE 209  LABCREA -- --  CREATININE 1.45* 0.95   Estimated Creatinine Clearance: 100.9 ml/min (by C-G formula based on Cr of 1.45). No results found for this basename: VANCOTROUGH:2,VANCOPEAK:2,VANCORANDOM:2,GENTTROUGH:2,GENTPEAK:2,GENTRANDOM:2,TOBRATROUGH:2,TOBRAPEAK:2,TOBRARND:2,AMIKACINPEAK:2,AMIKACINTROU:2,AMIKACIN:2, in the last 72 hours   Medical History: Past Medical History  Diagnosis Date  . Anginal pain   . CHF (congestive heart failure)   . Transposition of great vessel   . C. difficile diarrhea     2 weeks ago prior to admission 1/20 tx'ed with Flagyl     Medications:  Prescriptions prior to admission  Medication Sig Dispense Refill  . aspirin EC 81 MG tablet Take 81 mg by mouth daily.      . carvedilol (COREG CR) 20 MG 24 hr capsule Take 20 mg by mouth daily.      Marland Kitchen dextromethorphan-guaiFENesin (MUCINEX DM) 30-600 MG per 12 hr tablet Take 1 tablet by mouth every 12 (twelve) hours.      . enalapril (VASOTEC) 10 MG tablet Take 10 mg by mouth 2 (two) times daily.      Marland Kitchen esomeprazole (NEXIUM) 40 MG capsule Take 40  mg by mouth daily before breakfast.      . furosemide (LASIX) 20 MG tablet Take 20 mg by mouth 2 (two) times daily.      . Simethicone (GAS-X PO) Take 1 tablet by mouth daily as needed. For flatulence.      Marland Kitchen spironolactone (ALDACTONE) 25 MG tablet Take 25 mg by mouth daily.       Scheduled:    . antiseptic oral rinse  1 application Mouth Rinse QID  . aspirin EC  81 mg Oral Daily  . azithromycin  500 mg Intravenous Q24H  . chlorhexidine  15 mL Mouth/Throat BID  . enoxaparin (LOVENOX) injection  40 mg Subcutaneous Q24H  . [COMPLETED] etomidate      . etomidate      . [COMPLETED] etomidate  15 mg Intravenous Once  . [COMPLETED] fentaNYL      . [COMPLETED] furosemide  60 mg Intravenous Once  . insulin aspart  1-3 Units Subcutaneous Q4H  . lidocaine (cardiac) 100 mg/83ml      . [COMPLETED] midazolam      . [COMPLETED] morphine      . pantoprazole (PROTONIX) IV  40 mg Intravenous Q24H  . rocuronium      . succinylcholine       Assessment: 32 yo male with shock and concern for sepsis/PNA to start antibiotics (vancomycin, fortaz, and azithromycin 500mg  IV daily). SCr= 1.45 (up from 0.95) and CrCl~100 with UOP today= .  1/21 vanc>> 1/21 fortaz> 1/21 azithromycin  1/21 blood 1/21 resp 1/21 urine  Goal  of Therapy:  Vancomycin trough level 15-20 mcg/ml  Plan:  -Fortaz 1gm IV q8h and vancomycin 2500mg  IV x1 followed by 1250mg  IV q12h -Will follow renal function, cultures and clinical progress  Harland German, Pharm D 09/03/2012 10:01 AM

## 2012-09-03 NOTE — Procedures (Signed)
Name: YAW ESCOTO MRN: [id number] DOB: 1981-06-10   PROCEDURE NOTE  Procedure:  Endotracheal intubation.  Indication:  Acute respiratory failure  Consent:  Consent was implied due to the emergency nature of the procedure.  Anesthesia:  A total of 10 mg of Etomidate was given intravenously.  Procedure summary:  Appropriate equipment was assembled. The patient was identified as Jeremy Goodman and safety timeout was performed. The patient was placed supine, with head in sniffing position. After adequate level of anesthesia was achieved, a GS3 blade was inserted into the oropharynx and the vocal cords were visualized. A 8.0 endotracheal tube was inserted without difficulty and visualized going through the vocal cords. The stylette was removed and cuff inflated. Colorimetric change was noted on the CO2 meter. Breath sounds were heard over both lung fields equally. Post procedure chest xray was ordered.  Complications:  No immediate complications were noted.  Hemodynamic parameters and oxygenation remained stable throughout the procedure.    Orlean Bradford, M.D. Pulmonary and Critical Care Medicine [facility] Cell: [phone] Pager: [phone]  09/03/2012, 5:19 AM

## 2012-09-03 NOTE — Progress Notes (Signed)
INITIAL NUTRITION ASSESSMENT  DOCUMENTATION CODES Per approved criteria  -Obesity Unspecified   INTERVENTION: 1. Initiate TF of Osmolite 1.5 at 25 mL/hr, this is the goal rate. 60 mL Pro-stat 4x/day. This will provide 1700 kcals, 158 grams protein, and 457 mL free water. May require additional free water if no IV fluids   2. Multi-vitamin per tube daily   3. RD will continue to follow  NUTRITION DIAGNOSIS: Inadequate oral intake related to inability to eat as evidenced by being intubated .   Goal: Meet 60-70% of estimated kcal needs and >/=90% protein needs based on ASPEN guidelines for permissive underfeeding  Monitor:  Tolerance of Osmolite 1.5, I/O's, weight trends, vent status   Reason for Assessment: Consult to initiate tube feeding  32 y.o. male  Admitting Dx: VDRF  ASSESSMENT: Pt admitted with complaints of abdominal pain, feeling "shaky" all over, and diaphoresis  Pt has hx of of congenital CHF and is s/p transposition of great vessel. Pt [facility] on heart [facility]  Pt with recent hx of C diff and related weight loss (~17 lbs; 6-8% body weight, significant)  Placed on biPAP 1/21 and intubated  RD consulted for initiation of tube feeding. OG tube in place  Height: Ht Readings from Last 1 Encounters:  09/02/12 5\' 11"  (1.803 m)    Weight: Wt Readings from Last 1 Encounters:  09/02/12 274 lb 0.5 oz (124.3 kg)    Ideal Body Weight: 172 lbs  % Ideal Body Weight: 159%  Wt Readings from Last 10 Encounters:  09/02/12 274 lb 0.5 oz (124.3 kg)    Usual Body Weight: 285  % Usual Body Weight: 96%  BMI:  Body mass index is 38.22 kg/(m^2).--Obese  MVe: 14.4; Tmax: 37.4; no Propofol   Estimated Nutritional Needs: Kcal: 2622 (underfeeding goal: 9562-1308) Protein: 156 grams Fluid: 1.6-1.8  Skin: intact  Diet Order: NPO  EDUCATION NEEDS: -No education needs identified at this time   Intake/Output Summary (Last 24 hours) at 09/03/12  1150 Last data filed at 09/03/12 1100  Gross per 24 hour  Intake 2121.26 ml  Output   1575 ml  Net 546.26 ml    Last BM: PTA  Labs:   Lab 09/03/12 0500 09/02/12 2007  NA 131* --  K 4.6 --  CL 94* --  CO2 21 --  BUN 15 --  CREATININE 1.45* 0.95  CALCIUM 9.1 --  MG -- --  PHOS -- --  GLUCOSE 213* --    CBG (last 3)   Basename 09/03/12 0758  GLUCAP 164*    Scheduled Meds:   . antiseptic oral rinse  1 application Mouth Rinse QID  . aspirin EC  81 mg Oral Daily  . azithromycin  500 mg Intravenous Q24H  . cefTAZidime (FORTAZ)  IV  1 g Intravenous Q8H  . chlorhexidine  15 mL Mouth/Throat BID  . enoxaparin (LOVENOX) injection  40 mg Subcutaneous Q24H  . etomidate      . hydrocortisone sod succinate (SOLU-CORTEF) injection  50 mg Intravenous Q6H  . insulin aspart  1-3 Units Subcutaneous Q4H  . lidocaine (cardiac) 100 mg/79ml      . pantoprazole (PROTONIX) IV  40 mg Intravenous Q24H  . rocuronium      . succinylcholine      . vancomycin  1,250 mg Intravenous Q12H  . vancomycin  2,500 mg Intravenous Once    Continuous Infusions:   . sodium chloride 30 mL/hr at 09/03/12 0600  . amiodarone (NEXTERONE PREMIX)  360 mg/200 mL dextrose 60 mg/hr (09/03/12 0825)  . fentaNYL infusion INTRAVENOUS 50 mcg/hr (09/03/12 0315)  . furosemide (LASIX) infusion 4 mg/hr (09/03/12 0545)  . midazolam (VERSED) infusion 2 mg/hr (09/03/12 0315)  . norepinephrine (LEVOPHED) Adult infusion 32 mcg/min (09/03/12 0825)  . vasopressin (PITRESSIN) infusion - *FOR SHOCK*      Past Medical History  Diagnosis Date  . Anginal pain   . CHF (congestive heart failure)   . Transposition of great vessel   . C. difficile diarrhea     2 weeks ago prior to admission 1/20 tx'ed with Flagyl     Past Surgical History  Procedure Date  . Coronary artery bypass graft       Trenton Gammon Dietetic Intern # [phone]

## 2012-09-03 NOTE — Consult Note (Signed)
ELECTROPHYSIOLOGY CONSULT NOTE  Patient ID: Jeremy Goodman MRN: [id number], DOB/AGE: 13-Apr-1981   Admit date: 09/02/2012 Date of Consult: 09/03/2012  Primary Physician: No primary provider on file. Primary Cardiologist: Oswald Hillock, MD at [facility] Reason for Consultation: SVT  History of Present Illness Jeremy Goodman is a 32 year old gentleman with history of complex congenital heart disease with D-TGA, surgically treated with a Mustard procedure along with a left ventricle to pulmonary artery conduit who presented to [facility] ED c/o abdominal pain and palpitations. He was found to be tachycardic and hypotensive with heart rate at 170 bpm, BP 85/66. He was given IV Cardizem bolus followed by IV amiodarone bolus and a total of 1 liter normal saline. According to his admission H&P, he was mentally alert and in no distress. In addition, there is mention of a previous diagnosis of atrial fibrillation and heart failure in 2003. Also with recently diagnosed of C. difficle infection 2 weeks ago for which he was given Flagyl. While at [facility] ED, he was started on IV amiodarone infusion and transferred here for further evaluation. On arrival, his heart rate was in the 130s and, although hypotensive, he was alert and in no distress. Plans were made to hydrate him overnight and up-titrate his IV amiodarone for better rate control. However, at 2:30 AM today he developed acute respiratory failure with increasing hypoxia ultimately requiring intubation. He is now on IV Lasix infusion in addition to vasopressors. His echo is pending.  Jeremy Goodman mother tells me he has not felt well for the past 3 weeks. He has had sinus congestion and drainage with productive cough and abdominal pain. He was evaluated by his PCP who prescribed a course of antibiotics. Unfortunately, he developed C.diff colitis and was treated as an outpatient with Flagyl. He was scheduled for follow-up this week but  given his worsening abdominal pain and "jittering" he presented to [facility] ED. His mother states that he has also complained of dizziness with standing x 3-4 days. She is not aware of any episodes of syncope.  According to Dr. 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 (His TV and MV were structurally normal without stenosis or regurgitation and his coronary arteries were not specifically imaged) Of note, he has had palpitations dating back to 2005. He wore an event monitor at that time which documented sinus rhythm and sinus tachycardia.    Past Medical History Past Medical History  Diagnosis Date  . Anginal pain   . CHF (congestive heart failure)   . Transposition of great vessel   . C. difficile diarrhea     2 weeks ago prior to admission 1/20 tx'ed with Flagyl     Past Surgical History Past Surgical History  Procedure Date  . Coronary artery bypass graft      Allergies/Intolerances Allergies  Allergen Reactions  . Levaquin (Levofloxacin In D5w) Other (See Comments)    Hallucinations, and the medication affects his muscles.    Inpatient Medications    . antiseptic oral rinse  1 application Mouth Rinse QID  . aspirin EC  81 mg Oral Daily  . chlorhexidine  15 mL Mouth/Throat BID  .  enoxaparin (LOVENOX) injection  40 mg Subcutaneous Q24H  . etomidate      . insulin aspart  1-3 Units Subcutaneous Q4H  . lidocaine (cardiac) 100 mg/7ml      . pantoprazole (PROTONIX) IV  40 mg Intravenous Q24H  . rocuronium      . succinylcholine          . sodium chloride 30 mL/hr at 09/03/12 0600  . amiodarone  (NEXTERONE PREMIX) 360 mg/200 mL dextrose 60 mg/hr (09/03/12 0825)  . fentaNYL infusion INTRAVENOUS 50 mcg/hr (09/03/12 0315)  . furosemide (LASIX) infusion 4 mg/hr (09/03/12 0545)  . midazolam (VERSED) infusion 2 mg/hr (09/03/12 0315)  . norepinephrine (LEVOPHED) Adult infusion 33 mcg/min (09/03/12 0756)    Family History Family History  Problem Relation Age of Onset  . Atrial fibrillation Father   . Other Father     s/p pacemaker      Social History Social History  . Marital Status: Single   Social History Main Topics  . Smoking status: Never Smoker   . Smokeless tobacco: Not on file  . Alcohol Use: No  . Drug Use: No   Review of Systems Unable to obtain as patient is intubated and sedated  Physical Exam Blood pressure 87/69, pulse 126, temperature 99.4 F (37.4 C), temperature source Axillary, resp. rate 24, height 5\' 11"  (1.803 m), weight 274 lb 0.5 oz (124.3 kg), SpO2 99.00%.  General: Well developed, ill appearing 32 year old male. He is intubated and sedated. HEENT: Normocephalic, atraumatic. Sclera nonicteric. ET tube in place.  Neck: Supple. No JVD. Lungs: Mechanical ventilation. Respirations regular. Rales bilaterally auscultated anteriorly. Heart: Tachycardic, regular S1, S2. No murmurs, rub, S3 or S4. Abdomen: Soft, non-distended. BS present x 4 quadrants.  Extremities: No clubbing, cyanosis or edema.  Neuro: Deferred. Patient intubated and sedated.  Musculoskeletal: No kyphosis. Skin: Intact. Warm and dry. No rashes or petechiae in exposed areas.   Labs No results found for this basename: CKTOTAL:4,CKMB:4,TROPONINI:4 in the last 72 hours Lab Results  Component Value Date   WBC 24.8* 09/03/2012   HGB 17.2* 09/03/2012   HCT 51.3 09/03/2012   MCV 94.5 09/03/2012   PLT PLATELET CLUMPS NOTED ON SMEAR, COUNT APPEARS ADEQUATE 09/03/2012    Lab 09/03/12 0500  NA 131*  K 4.6  CL 94*  CO2 21  BUN 15  CREATININE 1.45*  CALCIUM 9.1  PROT 7.9  BILITOT 0.9    ALKPHOS 72  ALT 36  AST 35  GLUCOSE 213*   No components found with this basename: MAGNESIUM No components found with this basename: POCBNP:3 No results found for this basename: TSH,T4TOTAL,FREET3,T3FREE,THYROIDAB in the last 72 hours No results found for this basename: INR in the last 72 hours   Radiology/Studies Dg Chest Port 1 View  09/03/2012  *RADIOLOGY REPORT*  Clinical Data: Check endotracheal tube position.  PORTABLE CHEST - 1 VIEW  Comparison: Chest radiograph performed earlier today at 02:42 a.m.  Findings: The patient's endotracheal tube is seen ending 3-4 cm above the carina.  An enteric tube is seen extending below the diaphragm.  There is persistent diffuse bilateral airspace opacification, which may reflect mildly asymmetric pulmonary edema, right greater than left, or possibly multifocal pneumonia.  There is elevation of the left hemidiaphragm, as on the prior study.  Small bilateral pleural effusions are likely present.  No pneumothorax is seen.  The cardiomediastinal silhouette is enlarged.  No acute osseous abnormalities are identified.  IMPRESSION:  1.  Endotracheal tube seen ending  3-4 cm above the carina. 2.  Persistent diffuse bilateral airspace opacification, which may reflect mildly asymmetric pulmonary edema, right greater than left, or possibly multifocal pneumonia.  Likely small bilateral pleural effusions. 3.  Cardiomegaly noted.   Original Report Authenticated By: Tonia Ghent, M.D.    Dg Chest Port 1 View  09/03/2012  *RADIOLOGY REPORT*  Clinical Data: Respiratory distress.  PORTABLE CHEST - 1 VIEW  Comparison: Chest radiograph performed 09/02/2012  Findings: There appears to be significant elevation of the left hemidiaphragm, better characterized than on the prior study. Diffuse opacification of both lungs may reflect pulmonary edema or possibly pneumonia.  Underlying vascular congestion is seen.  No definite pleural effusion or pneumothorax is identified.  The  cardiomediastinal silhouette is mildly enlarged.  No acute osseous abnormalities are seen.  IMPRESSION: Diffuse opacification of both lungs may reflect pulmonary edema or possibly pneumonia; underlying vascular congestion and mild cardiomegaly noted.  Elevation of the left hemidiaphragm again noted.   Original Report Authenticated By: Tonia Ghent, M.D.     Echocardiogram  pending  12-lead ECG reviewed from [facility] ED Telemetry      Assessment and Plan  tachycardia, prob aflutter following afib;  chf complicating fluid resuscitation used for hypotension with persistent tachycardia.  Currently on leveophed for BP support  Have reviewed with DR Mary Sella at [facility] who will be looking for bed.  The next step is to try to elucidate the rhythm and to see if in fact atrial flutter as i suspect.  In the context of systemic ventricular dysfunction this needs particularly rapid restoration of normal rhtyhm.  This iwll require cardioversio and because of propensity to sinus node dysfunction will need to be done with back up brady support, and Dr RK suggests transvenous pacing wire for support.     Signed, Rick Duff, PA-C 09/03/2012, 9:06 AM  [place]>> aflutter 2:1 AV block  Will plan transfer to [facility] with DCCV planned for there

## 2012-09-03 NOTE — Discharge Summary (Signed)
ELECTROPHYSIOLOGY DISCHARGE SUMMARY    Patient ID: Jeremy Goodman,  MRN: [id number], DOB/AGE: Mar 30, 1981 30 y.o.  Admit date: 09/02/2012 Discharge date: 09/03/2012  Primary Care Physician: Lilyan Punt, MD Primary Cardiologist: Rivka Safer, MD at [facility]  Primary Discharge Diagnosis:  1. Atrial flutter 2. Cardiogenic shock 3. Congenital heart disease, d-TGA, s/p atrial septostomy and atrial septectomy in infancy for palliation, s/p Mustard procedure in childhood, severe subpulmonic stenosis requiring placement of a left ventricular to pulmonary artery conduit, severe dysfunction of the right ventricle (systemic ventricle) with EF approximately 30% by MRI December 2010, listed for cardiac transplant, blood type O, currently shaded. (His TV and MV were structurally normal without stenosis or regurgitation and his coronary arteries were not specifically imaged) 4. Systemic ventricle dysfunction, RV EF 30% by cardiac MRI December 2010  Secondary Discharge Diagnoses:  1. Recent upper respiratory infection 2. Recent C.diff colitis, treated as an outpatient with Flagyl x 2 weeks  Procedures This Admission:   1. Endotracheal intubation 09/03/2012 2. Ultrasound-guided central venous catheter placement 09/03/2012 - R femoral vein 3. Insertion of arterial catheter 09/03/2012 - R femoral artery 4. Transthoracic echocardiogram 09/03/2012 - report pending  History and Hospital Course:  Jeremy Goodman is a 32 year old gentleman with history of complex congenital heart disease with D-TGA, surgically treated with a Mustard procedure along with a left ventricle to pulmonary artery conduit who presented to [facility] ED c/o abdominal pain and palpitations. He was found to be tachycardic and hypotensive with heart rate at 170 bpm, BP 85/66. He was given IV Cardizem bolus followed by IV amiodarone bolus and a total of 1 liter normal saline. According to his admission H&P, he was mentally alert and in no  distress. In addition, there is mention of a previous diagnosis of atrial fibrillation and heart failure in 2003. Also with recently diagnosed of C. difficle infection 2 weeks ago for which he was given Flagyl. While at [facility] ED, he was started on IV amiodarone infusion and transferred here for further evaluation. On arrival, his heart rate was in the 130s and, although hypotensive, he was alert and in no distress. Plans were made to hydrate him overnight and up-titrate his IV amiodarone for better rate control. However, at 2:30 AM today he developed acute respiratory failure with increasing hypoxia ultimately requiring intubation. He is now on IV Lasix infusion in addition to vasopressors. His echo is pending.   [name] mother tells me he has not felt well for the past 3 weeks. He has had sinus congestion and drainage with productive cough and abdominal pain. He was evaluated by his PCP who prescribed a course of antibiotics. Unfortunately, he developed C.diff colitis and was treated as an outpatient with Flagyl. He was scheduled for follow-up this week but given his worsening abdominal pain and "jittering" he presented to [facility] ED. His mother states that he has also complained of dizziness with standing x 3-4 days. She is not aware of any episodes of syncope.   According to Dr. 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 (His TV and MV were structurally normal without stenosis or regurgitation and his coronary arteries were not specifically imaged)  Of note, he has had palpitations dating back to 2005. He wore an event monitor at  that time which documented sinus rhythm and sinus tachycardia.   While in the CCU here, he was given IV adenosine. There was no response with the initial dose of 6 mg x 1; however, after administration of 12 mg x 1, he developed brief AV block which revealed atrial flutter waves, confirming his rhythm is atrial flutter as we suspected. IV amiodarone was discontinued. Given the complexity of his congenital heart disease, now with atrial flutter and cardiogenic shock requiring hemodynamic support, Dr. Graciela Husbands recommended he be transferred to [facility] for further management, specifically for temporary pacing wire placement in preparation for cardioversion. Dr. Graciela Husbands discussed his care with Dr. Mary Sella who has accepted [name] in transfer. He will be transferred to [facility] for further cardiac/EP management today.   Discharge Vitals: Blood pressure 95/53, pulse 136, temperature 100.3 F (37.9 C), temp source axillary, resp. rate 24, height 5\' 11"  (1.803 m), weight 274 lb 0.5 oz (124.3 kg), SpO2 100.00%.   Labs: Lab Results  Component Value Date   WBC 24.8* 09/03/2012   HGB 17.2* 09/03/2012   HCT 51.3 09/03/2012   MCV 94.5 09/03/2012   PLT PLATELET CLUMPS NOTED ON SMEAR, COUNT APPEARS ADEQUATE 09/03/2012     Lab 09/03/12 0500  NA 131*  K 4.6  CL 94*  CO2 21  BUN 15  CREATININE 1.45*  CALCIUM 9.1  PROT 7.9  BILITOT 0.9  ALKPHOS 72  ALT 36  AST 35  GLUCOSE 213*    Discharge Medications . sodium chloride KVO rate 30 mL/hr at 09/03/12 0600  . fentanyl infusion  50 mcg/hr (09/03/12 0315)  . furosemide (LASIX) infusion 4 mg/hr (09/03/12 0545)  . midazolam (VERSED) infusion 2 mg/hr (09/03/12 0315)  . norepinephrine (LEVOPHED) adult infusion 16 mcg/min (09/03/12 1407)  . vasopressin (PITRESSIN) infusion   ordered but not given as of 09/03/2012 15:15   . antiseptic oral rinse  1 application Mouth Rinse QID  . aspirin EC  81 mg Oral Daily  .  azithromycin  500 mg Intravenous Q24H  . cefTAZidime (FORTAZ)  IV  1 g Intravenous Q8H  . chlorhexidine  15 mL Mouth/Throat BID  . enoxaparin (LOVENOX) injection  40 mg Subcutaneous Q24H  . hydrocortisone sod succinate (SOLU-CORTEF) injection  50 mg Intravenous Q6H  . insulin aspart  1-3 Units Subcutaneous Q4H  . multivitamin  5 mL Per Tube Daily  . pantoprazole (PROTONIX) IV  40 mg Intravenous Q24H  . vancomycin  1,250 mg Intravenous Q12H  . vancomycin  2,500 mg Intravenous Once   Duration of Transfer Encounter: Greater than 30 minutes including physician time.  Signed, Rick Duff, PA-C 09/03/2012, 3:11 PM

## 2012-09-03 NOTE — Procedures (Signed)
Arterial Catheter Insertion Procedure Note Jeremy Goodman [id number] Sep 27, 1980  Procedure: Insertion of Arterial Catheter  Indications: Blood pressure monitoring  Procedure Details Consent: Risks of procedure as well as the alternatives and risks of each were explained to the (patient/caregiver).  Consent for procedure obtained. Time Out: Verified patient identification, verified procedure, site/side was marked, verified correct patient position, special equipment/implants available, medications/allergies/relevent history reviewed, required imaging and test results available.  Performed  Maximum sterile technique was used including antiseptics, cap, gloves, gown, hand hygiene, mask and sheet. Skin prep: Chlorhexidine; local anesthetic administered 20 gauge catheter was inserted into right femoral artery using the Seldinger technique.  Evaluation Blood flow good; BP tracing good. Complications: No apparent complications  Extremely difficult anatomy, US guidance.  Failed left fem sterile, success on rt fem artery chloraprep placed  Jeremy Goodman. Jeremy Alias, MD, FACP Pgr: [phone] [facility]    Jeremy Goodman. 09/03/2012

## 2012-09-03 NOTE — Progress Notes (Signed)
Spoke with Cardiac fellow at [facility] they are happy to take the patient at anytime unfortunately no beds.  Dr. Windy Canny last note history of MUSTARD procedure in childhood, D transposition atrial septectomy, septostomy.  subpulmonic stenosis LD and PA conduit.  Listed for transplant unsure what happened with transplant status   CV MRI 2010-Vessels looked okay. Mod dilated RV, hypertrophy with basil hypokinesis, RV EF 33%. LV EF 55%. Ventricular septum looked okay,  LV to PA conduit was patent. RV O2 looked okay.   Desma Maxim MD [phone]

## 2012-09-03 NOTE — Progress Notes (Signed)
  Echocardiogram 2D Echocardiogram has been performed.  Jeremy Goodman 09/03/2012, 10:47 AM

## 2012-09-03 NOTE — Progress Notes (Signed)
Agree with note, additions and edits added in blue.  Clarene [facility] RD, LDN Pager [phone] After Hours pager [phone]

## 2012-09-03 NOTE — Consult Note (Signed)
PULMONARY  / CRITICAL CARE MEDICINE  Name: Jeremy Goodman MRN: [id number] DOB: Nov 04, 1980    LOS: 1  REFERRING MD :  [facility]   CHIEF COMPLAINT:  Acute respiratory failure   BRIEF PATIENT DESCRIPTION:  32 yo with congenital heart disease (transposition of the great vessels s/p surgical repair) and CHF transferred on 1/20 form [facility] where he presented with CVT and hypotension.  Developed acute pulmonary edema.  Intubated.  Hypotensive required vasopressors.  LINES / TUBES: R fem TLC 1/21 >>> OETT 1/21 >>> OGT 1/21 >>> Foley 1/21 >>>  CULTURES:  ANTIBIOTICS:  SIGNIFICANT EVENTS:  1/20 Transferred form [facility] with SVT / hypotension 1/21  Intubated for acute pulmonary edema.  Requires vasopressors.  LEVEL OF CARE:  CCU  PRIMARY SERVICE:  Cardiology  CONSULTANTS:  CCM  CODE STATUS:  Full  DIET:  NPO  DVT Px:  Lovenox   GI Px:  Protonix  The patient is encephalopathic and unable to provide history, which was obtained for available medical records.  HISTORY OF PRESENT ILLNESS:  32 yo with congenital heart disease (transposition of the great vessels s/p surgical repair) and CHF transferred on 1/20 form [facility] where he presented with CVT and hypotension.  Developed acute pulmonary edema.  Intubated.  Hypotensive required vasopressors.  PAST MEDICAL HISTORY :  Past Medical History  Diagnosis Date  . Anginal pain   . CHF (congestive heart failure)   . Transposition of great vessel   . C. difficile diarrhea     2 weeks ago prior to admission 1/20 tx'ed with Flagyl    Past Surgical History  Procedure Date  . Coronary artery bypass graft    Prior to Admission medications   Medication Sig Start Date End Date Taking? Authorizing Provider  aspirin EC 81 MG tablet Take 81 mg by mouth daily.   Yes Historical Provider, MD  carvedilol (COREG CR) 20 MG 24 hr capsule Take 20 mg by mouth daily.   Yes Historical Provider, MD    dextromethorphan-guaiFENesin (MUCINEX DM) 30-600 MG per 12 hr tablet Take 1 tablet by mouth every 12 (twelve) hours.   Yes Historical Provider, MD  enalapril (VASOTEC) 10 MG tablet Take 10 mg by mouth 2 (two) times daily.   Yes Historical Provider, MD  esomeprazole (NEXIUM) 40 MG capsule Take 40 mg by mouth daily before breakfast.   Yes Historical Provider, MD  furosemide (LASIX) 20 MG tablet Take 20 mg by mouth 2 (two) times daily.   Yes Historical Provider, MD  Simethicone (GAS-X PO) Take 1 tablet by mouth daily as needed. For flatulence.   Yes Historical Provider, MD  spironolactone (ALDACTONE) 25 MG tablet Take 25 mg by mouth daily.   Yes Historical Provider, MD   Allergies  Allergen Reactions  . Levaquin (Levofloxacin In D5w) Other (See Comments)    Hallucinations, and the medication affects his muscles.   FAMILY HISTORY:  Family History  Problem Relation Age of Onset  . Atrial fibrillation Father   . Other Father     s/p pacemaker    SOCIAL HISTORY:  reports that he has never smoked. He does not have any smokeless tobacco history on file. He reports that he does not drink alcohol or use illicit drugs.  REVIEW OF SYSTEMS:  Unable to assess   INTERVAL HISTORY:   VITAL SIGNS: Temp:  [97.9 F (36.6 C)-98.6 F (37 C)] 97.9 F (36.6 C) (01/21 0000) Pulse Rate:  [58-140] 127  (01/21 0400) Resp:  [  15-29] 18  (01/21 0400) BP: (68-134)/(44-96) 68/44 mmHg (01/21 0400) SpO2:  [88 %-99 %] 92 % (01/21 0400) FiO2 (%):  [100 %] 100 % (01/21 0320) Weight:  [274 lb 0.5 oz (124.3 kg)] 274 lb 0.5 oz (124.3 kg) (01/20 1831)  HEMODYNAMICS:    VENTILATOR SETTINGS: Vent Mode:  [-] PRVC FiO2 (%):  [100 %] 100 % Set Rate:  [24 bmp] 24 bmp Vt Set:  [600 mL] 600 mL PEEP:  [5 cmH20] 5 cmH20 Plateau Pressure:  [39 cmH20] 39 cmH20  INTAKE / OUTPUT: Intake/Output      01/20 0701 - 01/21 0700   P.O. 480   I.V. (mL/kg) 618.5 (5)   Total Intake(mL/kg) 1098.5 (8.8)   Urine (mL/kg/hr) 150  (0.1)   Total Output 150   Net +948.5         PHYSICAL EXAMINATION: General:  Lying in bed respiratory distress  Neuro:  Alert HEENT:  /at Cardiovascular:  Tachycardic, no murmurs Lungs:  Tachypneic, resp. Distress. course breath sounds b/l Abdomen:  Obese, soft, nl bs  Musculoskeletal:  Warm, no cyanosis or edema  Skin:  Intact   LABS:  Lab 09/02/12 2007  HGB 15.0  WBC 10.1  PLT 209  NA --  K --  CL --  CO2 --  GLUCOSE --  BUN --  CREATININE 0.95  CALCIUM --  MG --  PHOS --  AST --  ALT --  ALKPHOS --  BILITOT --  PROT --  ALBUMIN --  INR --  APTT --  INR --  LATICACIDVEN --  TROPONINI --  PHART --  PCO2ART --  PO2ART --  HCO3 --  O2SAT --    IMAGING: 1/21 PCXR  >>> ETT in good position, bilateral airspace disease, bilateral effusions  ECG:   DIAGNOSES: Active Problems:  Pulmonary edema  Acute respiratory failure  Transposition of great vessel  Cardiogenic shock  Acute pulmonary edema  Encephalopathy acute  SVT (supraventricular tachycardia)  ASSESSMENT / PLAN:  PULMONARY A: Acute respiratory failure secondary to acute pulmonary edema. P:   Full mechanical support Lasix gtt 5-20 goal net negative 50-75 mL/h Full support mechanical support Trend CXR / ABG Goal SpO2>90, pH>7.30 Daily SBT  CARDIOVASCULAR A: Acute exacerbation of congestive heart failure. Cardiogenic shock. SVT on admission, now sinus tachycardia. Transposition of vessels s/p multiple surgeries at [facility]. P:  Cardiology following, EP consult pending Amiodarone gtt  ASA Hold Enalapril, Coreg, and spironolactone due to hypotension Transfer to [facility] attempted - no bed available 2D echo  RENAL A:  No active issues. P:   Trend BMP Lasix as above  GASTROINTESTINAL A:  No active issues. P:   NPO as intubated TF if not extubated in 24 hours  HEMATOLOGIC A: No active issues. P:  Trend CBC  INFECTIOUS A: No active issues. P:   No intervention  required  ENDOCRINE A:  No active issues. P:   No intervention required  NEUROLOGIC A:  Acute encephalopathy. P:   Fentanyl / Versed gtt  Daily WUA  CLINICAL SUMMARY: 32 y.o found to be in acute pulmonary edema with acute respiratory failure intubated by critical care.  Now intubated and on Lasix drip monitoring strict intake and output.  He is hemodynamically unstable.  Cardiology is the primary with Critical care now following.    [phone] (cell) Father [phone]  Father [phone] (cell)  Desma Maxim MD [phone]  09/03/2012, 4:26 AM  I have personally obtained a history, examined  the patient, evaluated laboratory and imaging results, formulated the assessment and plan and placed orders.  CRITICAL CARE: The patient is critically ill with multiple organ systems failure and requires high complexity decision making for assessment and support, frequent evaluation and titration of therapies, application of advanced monitoring technologies and extensive interpretation of multiple databases. Critical Care Time devoted to patient care services described in this note is 60 minutes.   Lonia Farber, MD Pulmonary and Critical Care Medicine [facility] Pager: [phone]

## 2012-09-04 LAB — URINE CULTURE

## 2012-09-06 LAB — GLUCOSE, CAPILLARY: Glucose-Capillary: 168 mg/dL — ABNORMAL HIGH (ref 70–99)

## 2012-09-09 LAB — CULTURE, BLOOD (ROUTINE X 2): Culture: NO GROWTH

## 2012-09-17 DIAGNOSIS — Z79899 Other long term (current) drug therapy: Secondary | ICD-10-CM | POA: Diagnosis not present

## 2012-09-30 DIAGNOSIS — I498 Other specified cardiac arrhythmias: Secondary | ICD-10-CM | POA: Diagnosis not present

## 2012-09-30 DIAGNOSIS — I4892 Unspecified atrial flutter: Secondary | ICD-10-CM | POA: Diagnosis not present

## 2012-09-30 DIAGNOSIS — Z9889 Other specified postprocedural states: Secondary | ICD-10-CM | POA: Diagnosis not present

## 2012-09-30 DIAGNOSIS — Z8619 Personal history of other infectious and parasitic diseases: Secondary | ICD-10-CM | POA: Insufficient documentation

## 2012-09-30 DIAGNOSIS — Z79899 Other long term (current) drug therapy: Secondary | ICD-10-CM | POA: Diagnosis not present

## 2012-09-30 DIAGNOSIS — Q203 Discordant ventriculoarterial connection: Secondary | ICD-10-CM | POA: Diagnosis not present

## 2012-09-30 DIAGNOSIS — R002 Palpitations: Secondary | ICD-10-CM | POA: Diagnosis not present

## 2012-09-30 DIAGNOSIS — Z5181 Encounter for therapeutic drug level monitoring: Secondary | ICD-10-CM | POA: Diagnosis not present

## 2012-10-09 DIAGNOSIS — I471 Supraventricular tachycardia: Secondary | ICD-10-CM | POA: Diagnosis present

## 2012-10-09 DIAGNOSIS — Z9889 Other specified postprocedural states: Secondary | ICD-10-CM | POA: Diagnosis not present

## 2012-10-09 DIAGNOSIS — I4510 Unspecified right bundle-branch block: Secondary | ICD-10-CM | POA: Diagnosis present

## 2012-10-09 DIAGNOSIS — I509 Heart failure, unspecified: Secondary | ICD-10-CM | POA: Diagnosis present

## 2012-10-09 DIAGNOSIS — K219 Gastro-esophageal reflux disease without esophagitis: Secondary | ICD-10-CM | POA: Diagnosis present

## 2012-10-09 DIAGNOSIS — Q203 Discordant ventriculoarterial connection: Secondary | ICD-10-CM | POA: Diagnosis not present

## 2012-10-09 DIAGNOSIS — I959 Hypotension, unspecified: Secondary | ICD-10-CM | POA: Diagnosis not present

## 2012-10-09 DIAGNOSIS — R918 Other nonspecific abnormal finding of lung field: Secondary | ICD-10-CM | POA: Diagnosis not present

## 2012-10-09 DIAGNOSIS — I5022 Chronic systolic (congestive) heart failure: Secondary | ICD-10-CM | POA: Diagnosis present

## 2012-10-09 DIAGNOSIS — Z8701 Personal history of pneumonia (recurrent): Secondary | ICD-10-CM | POA: Diagnosis not present

## 2012-10-09 DIAGNOSIS — E669 Obesity, unspecified: Secondary | ICD-10-CM | POA: Diagnosis present

## 2012-10-09 DIAGNOSIS — I4892 Unspecified atrial flutter: Secondary | ICD-10-CM | POA: Diagnosis not present

## 2012-10-09 DIAGNOSIS — Z6838 Body mass index (BMI) 38.0-38.9, adult: Secondary | ICD-10-CM | POA: Diagnosis not present

## 2012-10-09 DIAGNOSIS — I441 Atrioventricular block, second degree: Secondary | ICD-10-CM | POA: Diagnosis present

## 2012-10-21 DIAGNOSIS — J069 Acute upper respiratory infection, unspecified: Secondary | ICD-10-CM | POA: Diagnosis not present

## 2012-11-15 ENCOUNTER — Ambulatory Visit (INDEPENDENT_AMBULATORY_CARE_PROVIDER_SITE_OTHER): Payer: Medicare Other | Admitting: Family Medicine

## 2012-11-15 ENCOUNTER — Encounter: Payer: Self-pay | Admitting: Family Medicine

## 2012-11-15 ENCOUNTER — Other Ambulatory Visit (HOSPITAL_COMMUNITY): Payer: Medicare Other

## 2012-11-15 ENCOUNTER — Other Ambulatory Visit: Payer: Self-pay | Admitting: Family Medicine

## 2012-11-15 ENCOUNTER — Ambulatory Visit (HOSPITAL_COMMUNITY)
Admission: RE | Admit: 2012-11-15 | Discharge: 2012-11-15 | Disposition: A | Discharge status: Home / Self Care | Payer: Medicare Other | Source: Ambulatory Visit | Attending: Family Medicine | Admitting: Family Medicine

## 2012-11-15 VITALS — BP 118/80 | HR 80 | Ht 71.0 in | Wt 271.6 lb

## 2012-11-15 DIAGNOSIS — L0291 Cutaneous abscess, unspecified: Secondary | ICD-10-CM

## 2012-11-15 DIAGNOSIS — R609 Edema, unspecified: Secondary | ICD-10-CM

## 2012-11-15 DIAGNOSIS — M79609 Pain in unspecified limb: Secondary | ICD-10-CM | POA: Diagnosis not present

## 2012-11-15 DIAGNOSIS — L03119 Cellulitis of unspecified part of limb: Secondary | ICD-10-CM

## 2012-11-15 DIAGNOSIS — M7989 Other specified soft tissue disorders: Secondary | ICD-10-CM | POA: Insufficient documentation

## 2012-11-15 DIAGNOSIS — L03116 Cellulitis of left lower limb: Secondary | ICD-10-CM

## 2012-11-15 DIAGNOSIS — L02419 Cutaneous abscess of limb, unspecified: Secondary | ICD-10-CM | POA: Diagnosis not present

## 2012-11-15 DIAGNOSIS — L0390 Cellulitis, unspecified: Secondary | ICD-10-CM | POA: Diagnosis not present

## 2012-11-15 MED ORDER — DOXYCYCLINE HYCLATE 100 MG PO CAPS: 100.0000 mg | ORAL_CAPSULE | Freq: Two times a day (BID) | ORAL | Status: DC

## 2012-11-15 NOTE — Patient Instructions (Signed)
Have a small abscess in the left groin. We will also be doing an ultrasound rule out blood clot. Doxycycline has been prescribed take one twice daily with a snack followed by a tall glass of water. I would also recommend warm compresses several times a day.in addition to this if fevers or if this area gets dramatically worse please call.  Culture of the wound was taken and I will recommend the results when he comes back he usually takes 3-5 days.

## 2012-11-15 NOTE — Progress Notes (Signed)
  Subjective:    Patient ID: Jeremy Goodman, male    DOB: 1980/11/21, 32 y.o.   MRN: [id number]  HPI Patient with erythematous area at the upper left inguinal region where a catheterization was done several weeks ago. Is red tender swollen. He also states some pain down into the legs. He is on a blood thinner Pradaxa and he is scheduled to have surgery next week he denies fever chills sweats nausea vomiting diarrhea he does have a history congenital heart disease.   Review of Systemssee above.     Objective:   Physical Exam Vital signs noted. Lungs are clear no crackles. Heart is regular murmur noted pulses are normal skin warm dry abdomen is soft left groin region has a small erythematous area tender swollen appears to be an abscess  This area was numbed with 1% lidocaine with epinephrine. #11 blade was used to drain pus from it. Culture was taken.       Assessment & Plan:  Doxycycline twice a day 10 days for cellulitis with abscess. Ultrasound of the leg was ordered to rule out DVT. The ultrasound did not show a DVT but did show a narrowed femoral artery we will discuss this with the patient and have copy of this sent to his specialist.he is to report to [place] if any complications fever swelling pain discomfort. I believe at this point in time to probably have to put surgery off.  It should be noted that the patient was informed regarding no DVT. He will inform his specialist regarding the ultrasound.

## 2012-11-18 ENCOUNTER — Ambulatory Visit (HOSPITAL_COMMUNITY): Payer: Medicare Other

## 2012-11-18 ENCOUNTER — Telehealth: Payer: Self-pay | Admitting: Family Medicine

## 2012-11-18 NOTE — Telephone Encounter (Signed)
Discussed with patient. Patient verbalized understanding. 

## 2012-11-18 NOTE — Telephone Encounter (Signed)
Patient states he is not have his surgery.  Wants to know if he can take a probiotic along with antibiotic.  Please give patient a call as soon as possible.

## 2012-11-18 NOTE — Telephone Encounter (Signed)
i'll call patient later today. Tell him he may take probiotic

## 2012-12-16 DIAGNOSIS — Q203 Discordant ventriculoarterial connection: Secondary | ICD-10-CM | POA: Diagnosis not present

## 2012-12-16 DIAGNOSIS — I4892 Unspecified atrial flutter: Secondary | ICD-10-CM | POA: Diagnosis not present

## 2012-12-17 DIAGNOSIS — I2789 Other specified pulmonary heart diseases: Secondary | ICD-10-CM | POA: Diagnosis not present

## 2012-12-17 DIAGNOSIS — I4892 Unspecified atrial flutter: Secondary | ICD-10-CM | POA: Diagnosis not present

## 2012-12-17 DIAGNOSIS — Q203 Discordant ventriculoarterial connection: Secondary | ICD-10-CM | POA: Diagnosis not present

## 2012-12-31 ENCOUNTER — Ambulatory Visit (INDEPENDENT_AMBULATORY_CARE_PROVIDER_SITE_OTHER): Payer: Medicare Other | Admitting: Family Medicine

## 2012-12-31 ENCOUNTER — Encounter: Payer: Self-pay | Admitting: Family Medicine

## 2012-12-31 VITALS — BP 122/78 | HR 80 | Wt 273.2 lb

## 2012-12-31 DIAGNOSIS — Q203 Discordant ventriculoarterial connection: Secondary | ICD-10-CM | POA: Diagnosis not present

## 2012-12-31 NOTE — Progress Notes (Signed)
  Subjective:    Patient ID: Jeremy Goodman, male    DOB: 1981-06-13, 32 y.o.   MRN: [id number]  HPI Overall doing well Had ICD implanted Here for follow up He denies any chest tightness pressure pain shortness breath or swelling in the legs. Has history heart disease. Family history noncontributory.  Review of Systems See above. No bleeding episodes no chest pressure or pain.    Objective:   Physical Exam  Constitutional: He appears well-developed and well-nourished.  Neck: Normal range of motion.  Cardiovascular: Normal rate and regular rhythm.   Pulmonary/Chest: Effort normal.  Musculoskeletal: He exhibits no edema.  Lymphadenopathy:    He has no cervical adenopathy.          Assessment & Plan:  ICD wound - well healed Normal sinus rythym Work on weight On pradaxa- warning signs discussed Overall this patient is doing very well. He has tried excising bring his weight down. His heart is stable. I find no evidence of any type of infection. We did discuss about importance of taking his medicine and also following a healthy diet staying physically active bring weight down. Followup again in the fall time.

## 2013-01-23 ENCOUNTER — Other Ambulatory Visit: Payer: Self-pay | Admitting: *Deleted

## 2013-01-23 ENCOUNTER — Other Ambulatory Visit: Payer: Self-pay | Admitting: Family Medicine

## 2013-01-23 MED ORDER — DABIGATRAN ETEXILATE MESYLATE 150 MG PO CAPS: 150.0000 mg | ORAL_CAPSULE | Freq: Two times a day (BID) | ORAL | Status: DC

## 2013-02-25 ENCOUNTER — Telehealth: Payer: Self-pay | Admitting: Family Medicine

## 2013-02-25 DIAGNOSIS — Q203 Discordant ventriculoarterial connection: Secondary | ICD-10-CM | POA: Diagnosis not present

## 2013-02-25 NOTE — Telephone Encounter (Signed)
Patient says he just had his appointment with [facility] today and they told him they would like for him to see a nutritionist and he would like to know who we recommend?

## 2013-02-25 NOTE — Telephone Encounter (Signed)
Assist pt with info to set up appt with nutritionist at [facility]. Rea: heart disease and obesity

## 2013-02-26 ENCOUNTER — Telehealth (HOSPITAL_COMMUNITY): Payer: Self-pay | Admitting: Dietician

## 2013-02-26 NOTE — Telephone Encounter (Signed)
Called back at 1612. Informed pt that [facility] services are free. Pt also requesrted to be notified if there is a cancellation in the schedule and would like to be seen sooner if possible.

## 2013-02-26 NOTE — Telephone Encounter (Signed)
Called pt at 13. Appointment scheduled for 03/12/13 at 0900.

## 2013-02-26 NOTE — Telephone Encounter (Signed)
Pt left voicemail at [phone] inquiring of cost of service.

## 2013-02-28 DIAGNOSIS — Q243 Pulmonary infundibular stenosis: Secondary | ICD-10-CM | POA: Insufficient documentation

## 2013-02-28 DIAGNOSIS — Z9889 Other specified postprocedural states: Secondary | ICD-10-CM | POA: Insufficient documentation

## 2013-03-10 ENCOUNTER — Telehealth (HOSPITAL_COMMUNITY): Payer: Self-pay | Admitting: Dietician

## 2013-03-10 NOTE — Telephone Encounter (Signed)
Received voicemail left on 03/10/13 at 0717. Pt reports he would like to change appointment to Tuesday AM if possible.

## 2013-03-10 NOTE — Telephone Encounter (Signed)
Called pt at 1646. Pt unavailable and unable to leave voicemail. Voicemail has not been set up.

## 2013-03-11 ENCOUNTER — Encounter (HOSPITAL_COMMUNITY): Payer: Self-pay | Admitting: Dietician

## 2013-03-11 NOTE — Telephone Encounter (Signed)
Called back at 0830, Pt rescheduled for 03/11/13 at 1030.

## 2013-03-11 NOTE — Telephone Encounter (Signed)
Received voicemail from pt left at 03/10/13 at 1714.

## 2013-03-11 NOTE — Progress Notes (Signed)
Outpatient Initial Nutrition Assessment  Date:03/11/2013   Appt Start Time: 1021  Referring Physician: Dr. Gerda Diss Reason for Visit: obesity, heart disease  Nutrition Assessment:  Height: 5\' 11"  (180.3 cm)   Weight: 267 lb (121.11 kg)   IBW: 172# %IBW: 160% UBW: 290# %UBW: 108% Body mass index is 37.26 kg/(m^2). Meets criteria for obesity, class II. Goal Weight: 240# (10% loss of current weight) Weight hx: Pt reports UBW of 215-220#, which he last weighed in 2003. He admits to progressive weight gain since then. Highest weight was 290# in December 2013. Noted 23# (7.9%) wt loss x 8 months.   Estimated nutritional needs:  Kcals/ day: 2100-2200 Protein (grams)/day: 97-121 Fluid (L)/ day: 2.1-2.2  PMH:  Past Medical History  Diagnosis Date  . Anginal pain   . CHF (congestive heart failure)   . Transposition of great vessel   . C. difficile diarrhea     2 weeks ago prior to admission 1/20 tx'ed with Flagyl     Medications:  Current Outpatient Rx  Name  Route  Sig  Dispense  Refill  . acetaminophen (TYLENOL) 325 MG tablet   Per Tube   Place 2 tablets (650 mg total) into feeding tube every 4 (four) hours as needed.         Marland Kitchen aspirin 81 MG tablet   Oral   Take 81 mg by mouth daily.         . dabigatran (PRADAXA) 150 MG CAPS   Oral   Take 1 capsule (150 mg total) by mouth every 12 (twelve) hours.   60 capsule   2   . dextromethorphan-guaiFENesin (MUCINEX DM) 30-600 MG per 12 hr tablet      1 tablet. Take 1 tablet by mouth every 12 (twelve) hours. As needed         . esomeprazole (NEXIUM) 40 MG capsule   Oral   Take 40 mg by mouth daily before breakfast.         . furosemide (LASIX) 20 MG tablet      20 mg. Take 1 tablet (20 mg total) by mouth 2 (two) times daily.         Marland Kitchen lisinopril (PRINIVIL,ZESTRIL) 2.5 MG tablet      2.5 mg. Take 1 tablet (2.5 mg total) by mouth daily.         . Loratadine 10 MG CAPS      1 tablet. 1 tab by mouth as needed as  needed         . metoprolol succinate (TOPROL-XL) 50 MG 24 hr tablet      75 mg. Take 1.5 tablets (75 mg total) by mouth daily.         . simethicone (MYLICON) 125 MG chewable tablet      125 mg. Take 125 mg by mouth every 6 (six) hours as needed.         Marland Kitchen spironolactone (ALDACTONE) 25 MG tablet      25 mg. Take 1 tablet (25 mg total) by mouth daily.           Labs: CMP     Component Value Date/Time   NA 131* 09/03/2012 0500   K 4.6 09/03/2012 0500   CL 94* 09/03/2012 0500   CO2 21 09/03/2012 0500   GLUCOSE 213* 09/03/2012 0500   BUN 15 09/03/2012 0500   CREATININE 1.45* 09/03/2012 0500   CALCIUM 9.1 09/03/2012 0500   PROT 7.9 09/03/2012 0500   ALBUMIN 4.0 09/03/2012  0500   AST 35 09/03/2012 0500   ALT 36 09/03/2012 0500   ALKPHOS 72 09/03/2012 0500   BILITOT 0.9 09/03/2012 0500   GFRNONAA 64* 09/03/2012 0500   GFRAA 74* 09/03/2012 0500    Lipid Panel  No results found for this basename: chol, trig, hdl, cholhdl, vldl, ldlcalc     No results found for this basename: HGBA1C   Lab Results  Component Value Date   CREATININE 1.45* 09/03/2012     Lifestyle/ social habits:  Mr. Laneve is a very pleasant gentleman who resides in [place] with his mother, who is disabled due to multiple medical problems. He is on disability, due to heart failure. He reports his stress level is 8/10, citing finances, family (particularly caring for his mother and father, who are both in poor health), and finances as his main sources of stress. He is physically inactive; he reports the last time he was physically active was when he participated in water aerobics several years ago. He reports that he stopped participating in sports in 2003, due to physician orders. He reports he does not have any restrictions in regards to physical activity, other than his own limits. He reveals he can only walk or ride his bike for 5 minutes and bending down causes SOB. He is hopeful to obtain a [employer]; he is  applying for financial assistance and reports a 2 week turnaround for application approval. He also reports that he has access to an exercise bike, weight room, and walking group in his community.   Nutrition hx/habits: Mr. Cornelio reports that he recently had defibrillator placement in May 2014 and his surgeon, Dr. Mary Sella at [facility], advised him to lose 50# by January. He reports he has lost weight steadily since January, but weight lost prior to defibrillator placement was due to sickness. He has lost approximately 6# (2.2%) in the past 2 months by his own efforts.  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 He reports that he has been on the heart transplant list previously, but was taken off since his condition was improving. He is very familiar with the low sodium diet and limits salt. However, he does admit to indulging in canned foods, junk foods, and fast food. He has started cutting back fast food consumption in the past month, due to financial concerns. He has started cooking at home and uses low and reduced sodium products, fresh vegetables from the garden, and using wraps and sandwich thins instead of sliced bread. He reports his surgeon told him that "all bread is bad for me, even whole wheat" and he was encouraged to eliminate bread from his diet.  He reports his main weaknesses are pizza and potato chips. He has discontinued drinking sodas and now beverages consist mainly of splenda sweetened beverages and water. Meal schedule is consistent and he admits to eating and snacking in front of the TV, due to stress.   Diet recall: Breakfast (7:30 AM): 1/2 chicken biscuit from [employer] and potato wedges OR LS bacon, egg whites, toast, and tomato OR cereal (cheerios, lucky charms, or forsted flakes); Lunch (11 AM or 2 PM): leftovers OR 1 lean pocket OR pizza OR hamburger and chips; Dinner: taco with wrap, ground [place], lettuce, tomato  Nutrition Diagnosis:  Nutrition  Intervention: Nutrition rx: 1800 kcal NAS, no sugar added diet; 3 meals per day; no snacks; low calorie beverages only; eat in an environment free from distractions; physical activity as tolerated  Education/Counseling Provided: Educated pt on principles of weight management. Discussed principles of energy expenditure and how changes in diet and physical activity affect weight status. Discussed nutritional content of commonly eaten foods and suggested healthier alternatives. Educated pt on plate method and a general, healthful diet that includes low fat dairy, lean meats, whole fruits and vegetables, and whole grains most often. Discussed importance of a healthy diet along with regular physical activity (at least 30 minutes 5 times per week) to achieve weight loss goals. Encouraged slow, moderate weight loss (0.5-2# weight loss per week) and adopting healthy lifestyle changes vs. obtaining a certain body type or weight. Encouraged weighing self weekly at a consistent day and time of choice. Provided counseling and specific strategies and examples to help to incorporate more physical activity into daily routine and improve food choices. Showed pt functionality of MyFitnessPal and encouraged using a food diary to better track caloric intake. Provided AND Nutrition Care Manual's "Weight Loss Tips" and "1800 Calorie Diet" handouts. Used TeachBack to assess understanding.   Understanding, Motivation, Ability to Follow Recommendations: Expect fair compliance.   Monitoring and Evaluation: Goals: 1) 0.5-2# weight loss per week; 2) Physical activity as tolerated  Recommendations: 1) For weight loss: 1500-1600 kcals daily; 2) Eat in an environment free from distractions; 3) Break up physical activity into smaller, more frequent sessions; 4) Choose an enjoyable exercise; 5) Exercise with a group, if possible; 6) Keep food diary  F/U: 4-6 weeks. Scheduled for 04/03/13 at 0900.  Melody Haver, RD, LDN 03/11/2013   Appt EndTime: 1140

## 2013-03-28 ENCOUNTER — Other Ambulatory Visit: Payer: Self-pay | Admitting: Family Medicine

## 2013-04-03 ENCOUNTER — Encounter (HOSPITAL_COMMUNITY): Payer: Self-pay | Admitting: Dietician

## 2013-04-03 NOTE — Progress Notes (Signed)
Follow-Up Outpatient Nutrition Note Date: 04/03/2013  Appt Start Time: 0857  Nutrition Assessment:  Current weight: Weight: 258 lb (117.028 kg)  BMI: Body mass index is 36 kg/(m^2).  Weight changes: -9# (3.45) x 1 month  Mr. Daudelin has made excellent progress. He has lost 9# this month and he is very proud of the accomplishment. He reports that his clothes are getting looser; he was unable to wear the shirt that he is wearing today last month. He has dropped to a 42 waist pant.  He started working out at [employer] last Monday. He enjoys water aerobics class, which lasts for 1 hour on Mondays, Wednesdays, and Fridays. He will occasionally swim laps, do pull ups, or water walk after class, if time permits.  He keeps a food diary on [email or web address], which he reports has helped him a lot. He has cut calories and sodium out of his diet by reading food labels and being conscious of serving sizes. He is still eating chips, but purchases the small snack size bags for portion control. Grains are mostly whole grains and water is the main beverage. He reports he overate one day last week and he got severe heartburn; this, he reports, will prevent him for recurrent episodes of overeating.  Overall, he feels great and is much more lively that he was at the last visit.   Labs: CMP     Component Value Date/Time   NA 131* 09/03/2012 0500   K 4.6 09/03/2012 0500   CL 94* 09/03/2012 0500   CO2 21 09/03/2012 0500   GLUCOSE 213* 09/03/2012 0500   BUN 15 09/03/2012 0500   CREATININE 1.45* 09/03/2012 0500   CALCIUM 9.1 09/03/2012 0500   PROT 7.9 09/03/2012 0500   ALBUMIN 4.0 09/03/2012 0500   AST 35 09/03/2012 0500   ALT 36 09/03/2012 0500   ALKPHOS 72 09/03/2012 0500   BILITOT 0.9 09/03/2012 0500   GFRNONAA 64* 09/03/2012 0500   GFRAA 74* 09/03/2012 0500    Lipid Panel  No results found for this basename: chol, trig, hdl, cholhdl, vldl, ldlcalc     No results found for this basename: HGBA1C   Lab Results  Component  Value Date   CREATININE 1.45* 09/03/2012     Diet recall: Formal recall not done, however, pt reports to choosing more healthful options. He has switched to no salt added potato chips and is now using homemade salsa made from vinegar and fresh vegetables from his garden instead of the canned store brand version. Portions are smaller, however, he reports 1-2 "bad days" per week- he reported he ate a whole container of taco meat one night last week. He reports that he choose healthy options 5-6 days out of the week. He is consuming approximately 1750 mg Na daily and 1800 kcals daily, per pt reports. He is using MyFitnessPal online food diary.   Nutrition Diagnosis: Excessive energy intake r/t physical inactivity, excessive snacking AEB diet recall, hx of progressive wt gain- continues.   Nutrition Intervention: Nutrition rx: 1800 kcal NAS, no sugar added diet; 3 meals per day; no snacks; low calorie beverages only; eat in an environment free from distractions; physical activity as tolerated  Education/ counseling provided: Reviewed diet recall. Discussed ways to improve diet and exercise regimen. Provided praise and emotional support for progress made. Discussed continuance of eat least 3 hours of exercise per week. Discussed healthier alternatives to snack foods and provided specific examples of such. Teachback method used.   Understanding/Motivation/ Ability  to follow recommendations: Expect fair to good compliance.   Monitoring and Evaluation: Previous Goals: 1) 0.5-2# weight loss per week; 2) Physical activity as tolerated  Goals for next visit: 1) 0.5-2# weight loss per week; 2) Minimum of 2.5 hours of physical activity per week  Recommendations: 1) Continue to keep food diary; 2) Plan grocery list and meals for the week; 3) Increase time and intensity on exercises gradually  F/U: 4-6 weeks. Scheduled for Monday, 05/05/13 at 1100.  Vann Okerlund A. Mayford Knife, RD, LDN Date:04/03/2013 Appt EndTime:  5621

## 2013-04-19 ENCOUNTER — Encounter (HOSPITAL_COMMUNITY): Payer: Self-pay | Admitting: Emergency Medicine

## 2013-04-19 ENCOUNTER — Telehealth: Payer: Self-pay | Admitting: Family Medicine

## 2013-04-19 ENCOUNTER — Emergency Department (INDEPENDENT_AMBULATORY_CARE_PROVIDER_SITE_OTHER)
Admission: EM | Admit: 2013-04-19 | Discharge: 2013-04-19 | Disposition: A | Discharge status: Home / Self Care | Payer: Medicare Other | Source: Home / Self Care

## 2013-04-19 DIAGNOSIS — R05 Cough: Secondary | ICD-10-CM

## 2013-04-19 DIAGNOSIS — J988 Other specified respiratory disorders: Secondary | ICD-10-CM

## 2013-04-19 DIAGNOSIS — R059 Cough, unspecified: Secondary | ICD-10-CM

## 2013-04-19 MED ORDER — CEFPROZIL 500 MG PO TABS: 500.0000 mg | ORAL_TABLET | Freq: Two times a day (BID) | ORAL | Status: AC

## 2013-04-19 NOTE — ED Notes (Signed)
C/o sinus pressure and pain. Productive cough with brown blood tinged sputum. Denies sob. Low grade temp. Onset Friday evening. Hx of heart problems. Pt has tried mucinex with no relief.

## 2013-04-19 NOTE — ED Provider Notes (Signed)
Medical screening examination/treatment/procedure(s) were performed by resident physician or non-physician practitioner and as supervising physician I was immediately available for consultation/collaboration.   Barkley Bruns MD.   Linna Hoff, MD 04/19/13 [phone]

## 2013-04-19 NOTE — Telephone Encounter (Signed)
Call from [facility] regarding patient. He felt that he had a sinus infection since last night - feels stopped up, weak, fatigued, and with subjective fever. He has been having chills. No facial pain or pressure. Per nurse, he "did not sound good." Reviewed chart including his extensive cardiac and respiratory history. Advised he be seen in UC. No meds called in.

## 2013-04-19 NOTE — ED Notes (Signed)
Pt requested to have prescription called to [facility] in [place] [phone]

## 2013-04-19 NOTE — ED Provider Notes (Signed)
CSN: [id number]     Arrival date & time 04/19/13  1404 History   None    Chief Complaint  Patient presents with  . Facial Pain    sinsus pressure and pain. cough. fatigue   (Consider location/radiation/quality/duration/timing/severity/associated sxs/prior Treatment) HPI Comments: Patient presents with a past medical history of CHF and cardiac surgery who presents with a 3 day history of cough, and malaise. Last pm cough became productive with brown colored thick sputum. Low grade temp is noted. No chest pain or SOB.   The history is provided by the patient.    Past Medical History  Diagnosis Date  . Anginal pain   . CHF (congestive heart failure)   . Transposition of great vessel   . C. difficile diarrhea     2 weeks ago prior to admission 1/20 tx'ed with Flagyl    Past Surgical History  Procedure Laterality Date  . Coronary artery bypass graft     Family History  Problem Relation Age of Onset  . Atrial fibrillation Father   . Other Father     s/p pacemaker    History  Substance Use Topics  . Smoking status: Never Smoker   . Smokeless tobacco: Not on file  . Alcohol Use: No    Review of Systems  All other systems reviewed and are negative.    Allergies  Levaquin  Home Medications   Current Outpatient Rx  Name  Route  Sig  Dispense  Refill  . aspirin 81 MG tablet   Oral   Take 81 mg by mouth daily.         . dabigatran (PRADAXA) 150 MG CAPS   Oral   Take 1 capsule (150 mg total) by mouth every 12 (twelve) hours.   60 capsule   2   . dextromethorphan-guaiFENesin (MUCINEX DM) 30-600 MG per 12 hr tablet      1 tablet. Take 1 tablet by mouth every 12 (twelve) hours. As needed         . furosemide (LASIX) 20 MG tablet      20 mg. Take 1 tablet (20 mg total) by mouth 2 (two) times daily.         Marland Kitchen lisinopril (PRINIVIL,ZESTRIL) 2.5 MG tablet      2.5 mg. Take 1 tablet (2.5 mg total) by mouth daily.         . Loratadine 10 MG CAPS      1  tablet. 1 tab by mouth as needed as needed         . metoprolol succinate (TOPROL-XL) 50 MG 24 hr tablet      75 mg. Take 1.5 tablets (75 mg total) by mouth daily.         Marland Kitchen NEXIUM 40 MG capsule      TAKE 1 CAPSULE BY MOUTH EVERY DAY.   30 capsule   1   . spironolactone (ALDACTONE) 25 MG tablet      25 mg. Take 1 tablet (25 mg total) by mouth daily.         Marland Kitchen acetaminophen (TYLENOL) 325 MG tablet   Per Tube   Place 2 tablets (650 mg total) into feeding tube every 4 (four) hours as needed.         . cefPROZIL (CEFZIL) 500 MG tablet   Oral   Take 1 tablet (500 mg total) by mouth 2 (two) times daily.   20 tablet   0   . simethicone (MYLICON) 125  MG chewable tablet      125 mg. Take 125 mg by mouth every 6 (six) hours as needed.          BP 123/74  Pulse 103  Temp(Src) 99.1 F (37.3 C) (Oral)  Resp 20  SpO2 93% Physical Exam  Nursing note and vitals reviewed. Constitutional: He is oriented to person, place, and time. He appears well-developed and well-nourished. No distress.  HENT:  Head: Normocephalic and atraumatic.  Neck: Normal range of motion.  Pulmonary/Chest: Effort normal and breath sounds normal. No respiratory distress. He has no wheezes. He has no rales.  Neurological: He is alert and oriented to person, place, and time.  Skin: Skin is warm and dry.  Psychiatric: His behavior is normal.    ED Course  Procedures (including critical care time) Labs Review Labs Reviewed - No data to display Imaging Review No results found.  MDM   1. Cough   2. Respiratory tract infection    Longtime heart patient with cough, fevers and brown productive sputum. Because of IC state will cover with ABX.     Azucena Fallen, PA-C 04/19/13 1538

## 2013-04-28 ENCOUNTER — Other Ambulatory Visit: Payer: Self-pay | Admitting: Family Medicine

## 2013-05-05 ENCOUNTER — Encounter (HOSPITAL_COMMUNITY): Payer: Self-pay | Admitting: Dietician

## 2013-05-05 NOTE — Progress Notes (Signed)
Follow-Up Outpatient Nutrition Note Date: 05/05/2013  Appt Start Time: 1102  Nutrition Assessment:  Current weight: Weight: 254 lb (115.214 kg)  BMI: Body mass index is 35.44 kg/(m^2).  Weight changes: -4# (1.6%) x 1 month  Jeremy Goodman continues to make good progress. He is happy that he lost weight but disappointment that he did not lose more 9"I wanted to lose 8 pounds"). He reports that he knew he could have done better, which is what frustrated him the most. He reveals has been sick for the past 2 weeks and, as a result, did not eat as well, did not fill out his food diary, and did not go to the gym during that period. He is afraid he will not lose the 50# by the beginning of the year that was recommended this year by his doctor. He has lost 13# (4.8%) in the past 2 months.  He continues to go to the [facility] 3 times a week, participating in 1 hour water aerobics class. He often stays 20-30 minutes after class and swims laps and does other water exercises.  He reports that he continues to choose healthier options. He recently discovered taco and chili seasoned Mrs. Dash. He plans to use that to season his chili, beans, and meat. He has also made some grilled chicken for sandwiches and salads. He has bought several loaves of low calorie bread at the dollar store, which he reports has fewer calories and sodium than the brand he usually buys.    Labs: CMP     Component Value Date/Time   NA 131* 09/03/2012 0500   K 4.6 09/03/2012 0500   CL 94* 09/03/2012 0500   CO2 21 09/03/2012 0500   GLUCOSE 213* 09/03/2012 0500   BUN 15 09/03/2012 0500   CREATININE 1.45* 09/03/2012 0500   CALCIUM 9.1 09/03/2012 0500   PROT 7.9 09/03/2012 0500   ALBUMIN 4.0 09/03/2012 0500   AST 35 09/03/2012 0500   ALT 36 09/03/2012 0500   ALKPHOS 72 09/03/2012 0500   BILITOT 0.9 09/03/2012 0500   GFRNONAA 64* 09/03/2012 0500   GFRAA 74* 09/03/2012 0500    Lipid Panel  No results found for this basename: chol,  trig,  hdl,  cholhdl,   vldl,  ldlcalc     No results found for this basename: HGBA1C   Lab Results  Component Value Date   CREATININE 1.45* 09/03/2012     Diet recall: Formal recall not done, however, pt reports to choosing more healthful options. Diet remains the same from last visit. However, pt reports that he "ate bad" for the past 2 weeks, due to sickness and not having adequate groceries dur to sickness. He reports he chose high sodium foods such as bread and spam during that time. He has stopped using MyFitnessPal but reports he is will try to resume it.   Nutrition Diagnosis: Excessive energy intake r/t physical inactivity, excessive snacking AEB diet recall, hx of progressive wt gain- continues.   Nutrition Intervention: Nutrition rx: 1800 kcal NAS, no sugar added diet; 3 meals per day; no snacks; low calorie beverages only; eat in an environment free from distractions; physical activity as tolerated  Education/ counseling provided: Reviewed diet recall. Discussed ways to improve diet and exercise regimen. Provided praise and emotional support for progress made. Had a long discussion about obtaining a healthy lifestyle vs. Attaining a specific body weight or type. Discussed continuance of eat least 3 hours of exercise per week. Discussed healthier alternatives to snack  foods and provided specific examples of such. Teachback method used.   Understanding/Motivation/ Ability to follow recommendations: Expect fair to good compliance.   Monitoring and Evaluation: Previous Goals: 1) 0.5-2# weight loss per week- goal met; 2) Minimum of 2.5 hours of physical activity per week- goal met  Goals for next visit: 1) 0.5-2# weight loss per week; 2) Minimum of 2.5 hours of physical activity per week  Recommendations: 1) Resume food diary; 2) Plan grocery list and meals for the week; 3) Increase time and intensity on exercises gradually  F/U: 4-6 weeks. Scheduled for Monday, 06/09/13 at 1100.  Joliene Salvador A. Mayford Knife,  RD, LDN Date:05/05/2013 Appt EndTime: 1125

## 2013-05-07 ENCOUNTER — Ambulatory Visit (INDEPENDENT_AMBULATORY_CARE_PROVIDER_SITE_OTHER): Payer: Medicare Other | Admitting: Family Medicine

## 2013-05-07 ENCOUNTER — Encounter: Payer: Self-pay | Admitting: Family Medicine

## 2013-05-07 VITALS — BP 112/72 | Ht 71.0 in | Wt 262.4 lb

## 2013-05-07 DIAGNOSIS — J019 Acute sinusitis, unspecified: Secondary | ICD-10-CM

## 2013-05-07 MED ORDER — CEFPROZIL 500 MG PO TABS: 500.0000 mg | ORAL_TABLET | Freq: Two times a day (BID) | ORAL | Status: DC

## 2013-05-07 NOTE — Progress Notes (Signed)
  Subjective:    Patient ID: Jeremy Goodman, male    DOB: Jan 05, 1981, 32 y.o.   MRN: [id number]  HPI Patient is here today for check up.   He recently got a defibrillator put in on May 5th. Patient overall is doing well he is trying to lose weight he is excise watching diet closely. He does relate some head congestion drainage coughing up clear phlegm. Was on antibiotic the phlegm is now clear it seems to be doing better no high fevers no wheezing difficulty breathing. Does have a history of congenital heart disease. No concerns.  Family history noncontributory social does not smoke   Review of Systems See above    Objective:   Physical Exam Eardrums are normal throat is normal neck supple lungs clear hearts regular abdomen is soft murmur is noted       Assessment & Plan:  Cardiac-stable continue current measures Obesity encourage exercise and try to lose weight Recent sinus infection seems to be going away I gave him a prescription for Cefzil that he could get filled if he gets worse he is going away for a few days. He'll followup here a regular basis.

## 2013-06-02 ENCOUNTER — Ambulatory Visit: Payer: Medicare Other | Admitting: Family Medicine

## 2013-06-02 ENCOUNTER — Other Ambulatory Visit: Payer: Self-pay | Admitting: Family Medicine

## 2013-06-09 ENCOUNTER — Other Ambulatory Visit: Payer: Self-pay | Admitting: Family Medicine

## 2013-06-09 ENCOUNTER — Encounter (HOSPITAL_COMMUNITY): Payer: Self-pay | Admitting: Dietician

## 2013-06-09 NOTE — Progress Notes (Signed)
Follow-Up Outpatient Nutrition Note Date: 06/09/2013  Appt Start Time: 1103  Nutrition Assessment:  Current weight: Weight: 254 lb (115.214 kg)  BMI: Body mass index is 36.45 kg/(m^2).  Weight changes: +4# (1.5%) x 1 month  Jeremy Goodman progress has unfortunately deteriorated. He reports he "fell off the wagon" 2 weeks a go due to his week long trip to [place] with his church. Due to this trip, he reports he was unable to follow his diet and exercise routine, despite his desire to continue to follow a healthy lifestyle. He reveals that during this trip, many high calorie foods, such as pizza, were served. Additionally, he was unable to use the hotel's pool as planned because it was out of order and the hotel did not have a fitness center. Unfortunately, he also reported to using the transportation provided by the hotel to the convention center, which was only a 1 mile walk from the hotel.  He reports he is trying his best to continue his efforts after his return, although he continues to choose pizza and chips. He is trying to find healthier snack items in the grocery stores. He reports that he found reduced sodium [employer] chips, which are only 75 mg of sodium for 16 chips, however, he reports he ate the whole container. He reported that he drank sodas during his trip, but discontinued that practice after his return. He continues to do water aerobics at the [facility], which is enjoys.   Labs: CMP     Component Value Date/Time   NA 131* 09/03/2012 0500   K 4.6 09/03/2012 0500   CL 94* 09/03/2012 0500   CO2 21 09/03/2012 0500   GLUCOSE 213* 09/03/2012 0500   BUN 15 09/03/2012 0500   CREATININE 1.45* 09/03/2012 0500   CALCIUM 9.1 09/03/2012 0500   PROT 7.9 09/03/2012 0500   ALBUMIN 4.0 09/03/2012 0500   AST 35 09/03/2012 0500   ALT 36 09/03/2012 0500   ALKPHOS 72 09/03/2012 0500   BILITOT 0.9 09/03/2012 0500   GFRNONAA 64* 09/03/2012 0500   GFRAA 74* 09/03/2012 0500    Lipid Panel  No results found for  this basename: chol,  trig,  hdl,  cholhdl,  vldl,  ldlcalc     No results found for this basename: HGBA1C   Lab Results  Component Value Date   CREATININE 1.45* 09/03/2012     Nutrition Diagnosis: Excessive energy intake r/t physical inactivity, excessive snacking AEB diet recall, hx of progressive wt gain- continues.   Nutrition Intervention: Nutrition rx: 1800 kcal NAS, no sugar added diet; 3 meals per day; no snacks; low calorie beverages only; eat in an environment free from distractions; physical activity as tolerated  Education/ counseling provided: Provided emotional support and discussed ways for pt to get back on track. Also discussed ways to continue exercise and healthy eating habits when away from home. Provided praise and emotional support for progress made. Discussed continuance of eat least 3 hours of exercise per week. Discussed healthier alternatives to snack foods and provided specific examples of such. Also discussed importance of portion control to maintain calorie and sodium intake. Teachback method used.   Understanding/Motivation/ Ability to follow recommendations: Expect fair to good compliance.   Monitoring and Evaluation: Previous Goals: 1) 0.5-2# weight loss per week- deteriorating; 2) Minimum of 2.5 hours exercise per week- progressing  Goals for next visit: 1) 0.5-2# weight loss per week; 2) Minimum of 2.5 hours of physical activity per week  Recommendations: 1) Keep food  diary; 2) Plan grocery list and meals for the week; 3) Increase time and intensity on exercises gradually  F/U: 4-6 weeks. Scheduled for Monday, 07/10/13 at 1100.  Tamer Baughman A. Mayford Knife, RD, LDN Date:06/09/2013 Appt EndTime: 1135

## 2013-06-19 ENCOUNTER — Other Ambulatory Visit: Payer: Self-pay

## 2013-07-03 DIAGNOSIS — Q205 Discordant atrioventricular connection: Secondary | ICD-10-CM | POA: Diagnosis not present

## 2013-07-03 DIAGNOSIS — Z95810 Presence of automatic (implantable) cardiac defibrillator: Secondary | ICD-10-CM | POA: Diagnosis not present

## 2013-07-03 DIAGNOSIS — Z23 Encounter for immunization: Secondary | ICD-10-CM | POA: Diagnosis not present

## 2013-07-03 DIAGNOSIS — I509 Heart failure, unspecified: Secondary | ICD-10-CM | POA: Diagnosis not present

## 2013-07-03 DIAGNOSIS — Z9889 Other specified postprocedural states: Secondary | ICD-10-CM | POA: Diagnosis not present

## 2013-07-03 DIAGNOSIS — Q203 Discordant ventriculoarterial connection: Secondary | ICD-10-CM | POA: Diagnosis not present

## 2013-07-03 DIAGNOSIS — Q243 Pulmonary infundibular stenosis: Secondary | ICD-10-CM | POA: Diagnosis not present

## 2013-07-14 ENCOUNTER — Encounter: Payer: Self-pay | Admitting: Family Medicine

## 2013-07-14 ENCOUNTER — Ambulatory Visit (INDEPENDENT_AMBULATORY_CARE_PROVIDER_SITE_OTHER): Payer: Medicare Other | Admitting: Family Medicine

## 2013-07-14 VITALS — BP 138/86 | Ht 71.0 in | Wt 267.0 lb

## 2013-07-14 DIAGNOSIS — J019 Acute sinusitis, unspecified: Secondary | ICD-10-CM

## 2013-07-14 MED ORDER — CEFPROZIL 500 MG PO TABS: 500.0000 mg | ORAL_TABLET | Freq: Two times a day (BID) | ORAL | Status: DC

## 2013-07-14 NOTE — Progress Notes (Signed)
   Subjective:    Patient ID: Jeremy Goodman, male    DOB: [birth year], 32 y.o.   MRN: [id number]  Sinus Problem This is a new problem. The current episode started in the past 7 days. Associated symptoms include congestion and coughing. Pertinent negatives include no ear pain.   PMH cardiac   Review of Systems  Constitutional: Negative for fever and activity change.  HENT: Positive for congestion and rhinorrhea. Negative for ear pain.   Eyes: Negative for discharge.  Respiratory: Positive for cough. Negative for wheezing.   Cardiovascular: Negative for chest pain.       Objective:   Physical Exam  Nursing note and vitals reviewed. Constitutional: He appears well-developed.  HENT:  Head: Normocephalic.  Mouth/Throat: Oropharynx is clear and moist. No oropharyngeal exudate.  Neck: Normal range of motion.  Cardiovascular: Normal rate, regular rhythm and normal heart sounds.   No murmur heard. Pulmonary/Chest: Effort normal and breath sounds normal. He has no wheezes.  Lymphadenopathy:    He has no cervical adenopathy.  Neurological: He exhibits normal muscle tone.  Skin: Skin is warm and dry.          Assessment & Plan:  Acute sinusitis-antibiotics discussed and send him. Warning signs discussed. Followup if ongoing trouble. Cardiac he follows up with his cardiologist on a regular basis he is encouraged to lose weight

## 2013-07-31 ENCOUNTER — Other Ambulatory Visit: Payer: Self-pay | Admitting: Family Medicine

## 2013-08-16 ENCOUNTER — Emergency Department (INDEPENDENT_AMBULATORY_CARE_PROVIDER_SITE_OTHER)
Admission: EM | Admit: 2013-08-16 | Discharge: 2013-08-16 | Disposition: A | Discharge status: Home / Self Care | Payer: Medicare Other | Source: Home / Self Care

## 2013-08-16 ENCOUNTER — Encounter (HOSPITAL_COMMUNITY): Payer: Self-pay | Admitting: Emergency Medicine

## 2013-08-16 DIAGNOSIS — R05 Cough: Secondary | ICD-10-CM | POA: Diagnosis not present

## 2013-08-16 DIAGNOSIS — R059 Cough, unspecified: Secondary | ICD-10-CM | POA: Diagnosis not present

## 2013-08-16 DIAGNOSIS — J020 Streptococcal pharyngitis: Secondary | ICD-10-CM

## 2013-08-16 DIAGNOSIS — J329 Chronic sinusitis, unspecified: Secondary | ICD-10-CM

## 2013-08-16 DIAGNOSIS — J309 Allergic rhinitis, unspecified: Secondary | ICD-10-CM | POA: Diagnosis not present

## 2013-08-16 LAB — POCT RAPID STREP A: STREPTOCOCCUS, GROUP A SCREEN (DIRECT): POSITIVE — AB

## 2013-08-16 MED ORDER — CETIRIZINE-PSEUDOEPHEDRINE ER 5-120 MG PO TB12: 1.0000 | ORAL_TABLET | Freq: Every day | ORAL | Status: DC

## 2013-08-16 MED ORDER — HYDROCOD POLST-CHLORPHEN POLST 10-8 MG/5ML PO LQCR: 5.0000 mL | Freq: Two times a day (BID) | ORAL | Status: DC | PRN

## 2013-08-16 MED ORDER — CEFDINIR 300 MG PO CAPS: 300.0000 mg | ORAL_CAPSULE | Freq: Two times a day (BID) | ORAL | Status: DC

## 2013-08-16 NOTE — ED Notes (Signed)
C/o sinus problems States he has a cough, sore throat, headache and chills States every few months he has these sx especially in the cold season mucinex dm was taking as tx

## 2013-08-16 NOTE — Discharge Instructions (Signed)
Follow up with primary care provider regarding allergy testing and treatment.  Strep Throat Strep throat is an infection of the throat caused by a bacteria named Streptococcus pyogenes. Your caregiver may call the infection streptococcal "tonsillitis" or "pharyngitis" depending on whether there are signs of inflammation in the tonsils or back of the throat. Strep throat is most common in children aged 31 15 years during the cold months of the year, but it can occur in people of any age during any season. This infection is spread from person to person (contagious) through coughing, sneezing, or other close contact. SYMPTOMS   Fever or chills.  Painful, swollen, red tonsils or throat.  Pain or difficulty when swallowing.  White or yellow spots on the tonsils or throat.  Swollen, tender lymph nodes or "glands" of the neck or under the jaw.  Red rash all over the body (rare). DIAGNOSIS  Many different infections can cause the same symptoms. A test must be done to confirm the diagnosis so the right treatment can be given. A "rapid strep test" can help your caregiver make the diagnosis in a few minutes. If this test is not available, a light swab of the infected area can be used for a throat culture test. If a throat culture test is done, results are usually available in a day or two. TREATMENT  Strep throat is treated with antibiotic medicine. HOME CARE INSTRUCTIONS   Gargle with 1 tsp of salt in 1 cup of warm water, 3 4 times per day or as needed for comfort.  Family members who also have a sore throat or fever should be tested for strep throat and treated with antibiotics if they have the strep infection.  Make sure everyone in your household washes their hands well.  Do not share food, drinking cups, or personal items that could cause the infection to spread to others.  You may need to eat a soft food diet until your sore throat gets better.  Drink enough water and fluids to keep your  urine clear or pale yellow. This will help prevent dehydration.  Get plenty of rest.  Stay home from school, daycare, or work until you have been on antibiotics for 24 hours.  Only take over-the-counter or prescription medicines for pain, discomfort, or fever as directed by your caregiver.  If antibiotics are prescribed, take them as directed. Finish them even if you start to feel better. SEEK MEDICAL CARE IF:   The glands in your neck continue to enlarge.  You develop a rash, cough, or earache.  You cough up green, yellow-brown, or bloody sputum.  You have pain or discomfort not controlled by medicines.  Your problems seem to be getting worse rather than better. SEEK IMMEDIATE MEDICAL CARE IF:   You develop any new symptoms such as vomiting, severe headache, stiff or painful neck, chest pain, shortness of breath, or trouble swallowing.  You develop severe throat pain, drooling, or changes in your voice.  You develop swelling of the neck, or the skin on the neck becomes red and tender.  You have a fever.  You develop signs of dehydration, such as fatigue, dry mouth, and decreased urination.  You become increasingly sleepy, or you cannot wake up completely. Document Released: 07/28/2000 Document Revised: 07/17/2012 Document Reviewed: 09/29/2010 [facility] Patient Information 2014 [place], [place].   Allergies  Allergies may happen from anything your body is sensitive to. This may be food, medicines, pollens, chemicals, and many other things. Food allergies can be  severe and deadly.  HOME CARE  If you do not know what causes a reaction, keep a diary. Write down the foods you ate and the symptoms that followed. Avoid foods that cause reactions.  If you have red raised spots (hives) or a rash:  Take medicine as told by your doctor.  Use medicines for red raised spots and itching as needed.  Apply cold cloths (compresses) to the skin. Take a cool bath. Avoid hot baths or  showers.  If you are severely allergic:  It is often necessary to go to the hospital after you have treated your reaction.  Wear your medical alert jewelry.  You and your family must learn how to give a allergy shot or use an allergy kit (anaphylaxis kit).  Always carry your allergy kit or shot with you. Use this medicine as told by your doctor if a severe reaction is occurring. GET HELP RIGHT AWAY IF:  You have trouble breathing or are making high-pitched whistling sounds (wheezing).  You have a tight feeling in your chest or throat.  You have a puffy (swollen) mouth.  You have red raised spots, puffiness (swelling), or itching all over your body.  You have had a severe reaction that was helped by your allergy kit or shot. The reaction can return once the medicine has worn off.  You think you are having a food allergy. Symptoms most often happen within 30 minutes of eating a food.  Your symptoms have not gone away within 2 days or are getting worse.  You have new symptoms.  You want to retest yourself with a food or drink you think causes an allergic reaction. Only do this under the care of a doctor. MAKE SURE YOU:   Understand these instructions.  Will watch your condition.  Will get help right away if you are not doing well or get worse. Document Released: 11/25/2012 Document Reviewed: 11/25/2012 [facility] Patient Information 2014 [place], [place].

## 2013-08-16 NOTE — ED Provider Notes (Signed)
CSN: [id number]     Arrival date & time 08/16/13  1013 History   None    Chief Complaint  Patient presents with  . Facial Pain   (Consider location/radiation/quality/duration/timing/severity/associated sxs/prior Treatment)  HPI  Patient presents with a sore throat and cough since yesterday. Patient reports history of frequent sinus infections and environmental allergies. Last antibiotic for sinus infection was taken in October. 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 The patient has history of congestive heart failure.    Past Medical History  Diagnosis Date  . Anginal pain   . CHF (congestive heart failure)   . Transposition of great vessel   . C. difficile diarrhea     2 weeks ago prior to admission 1/20 tx'ed with Flagyl    Past Surgical History  Procedure Laterality Date  . Coronary artery bypass graft     Family History  Problem Relation Age of Onset  . Atrial fibrillation Father   . Other Father     s/p pacemaker    History  Substance Use Topics  . Smoking status: Never Smoker   . Smokeless tobacco: Not on file  . Alcohol Use: No    Review of Systems  Constitutional: Negative.   HENT: Negative.   Eyes: Negative.   Respiratory: Positive for cough and shortness of breath. Negative for wheezing.   Cardiovascular: Negative.   Gastrointestinal: Negative.   Endocrine: Negative.   Genitourinary: Negative.   Musculoskeletal: Negative.   Skin: Negative.   Allergic/Immunologic: Positive for environmental allergies.  Neurological: Negative.   Hematological: Negative.   Psychiatric/Behavioral: Negative.    The patient states his shortness of breath is usual per his coronary history. Denies any increase or change in shortness of breath chest pain or cardiac discomfort.  Allergies  Levaquin  Home Medications   Current  Outpatient Rx  Name  Route  Sig  Dispense  Refill  . aspirin 81 MG tablet   Oral   Take 81 mg by mouth daily.         . furosemide (LASIX) 20 MG tablet      20 mg. Take 1 tablet (20 mg total) by mouth 2 (two) times daily.         Marland Kitchen lisinopril (PRINIVIL,ZESTRIL) 2.5 MG tablet      2.5 mg. Take 1 tablet (2.5 mg total) by mouth daily.         . Loratadine 10 MG CAPS      1 tablet. 1 tab by mouth as needed as needed         . metoprolol succinate (TOPROL-XL) 50 MG 24 hr tablet      TAKE 1 AND 1/2 TABLETS BY MOUTH EVERY DAY.   45 tablet   4   . NEXIUM 40 MG capsule      TAKE 1 CAPSULE BY MOUTH EVERY DAY.   30 capsule   5   . PRADAXA 150 MG CAPS capsule      TAKE ONE CAPSULE BY MOUTH EVERY 12 HOURS.   60 capsule   2   . simethicone (MYLICON) 409 MG chewable tablet      125 mg. Take 125 mg by mouth every 6 (six) hours as needed.         Marland Kitchen spironolactone (ALDACTONE) 25 MG tablet      25 mg. Take 1 tablet (  25 mg total) by mouth daily.         Marland Kitchen acetaminophen (TYLENOL) 325 MG tablet   Per Tube   Place 2 tablets (650 mg total) into feeding tube every 4 (four) hours as needed.         . cefdinir (OMNICEF) 300 MG capsule   Oral   Take 1 capsule (300 mg total) by mouth 2 (two) times daily.   20 capsule   0   . cefPROZIL (CEFZIL) 500 MG tablet   Oral   Take 1 tablet (500 mg total) by mouth 2 (two) times daily.   20 tablet   0   . cetirizine-pseudoephedrine (ZYRTEC-D) 5-120 MG per tablet   Oral   Take 1 tablet by mouth daily.   15 tablet   0   . chlorpheniramine-HYDROcodone (TUSSIONEX PENNKINETIC ER) 10-8 MG/5ML LQCR   Oral   Take 5 mLs by mouth every 12 (twelve) hours as needed for cough.   115 mL   0    BP 120/52  Pulse 77  Temp(Src) 98.9 F (37.2 C) (Oral)  Resp 18  SpO2 97%  Physical Exam  Nursing note and vitals reviewed. Constitutional: He is oriented to person, place, and time. He appears well-developed and well-nourished. No  distress.  Neck: Normal range of motion. Neck supple.  Mild anterior cervical lymphadenopathy noted.  Cardiovascular: Normal rate, regular rhythm and intact distal pulses.  Exam reveals no gallop and no friction rub.   Murmur heard. No pedal edema.  Pulmonary/Chest: Effort normal and breath sounds normal. No respiratory distress. He has no wheezes. He has no rales. He exhibits no tenderness.  No adventitious breath sounds noted.  Lymphadenopathy:    He has cervical adenopathy.  Neurological: He is alert and oriented to person, place, and time.  Skin: Skin is warm and dry. No rash noted. He is not diaphoretic. No erythema. No pallor.    ED Course  Procedures (including critical care time) Labs Review Labs Reviewed  POCT RAPID STREP A ([facility]RG CARE ONLY) - Abnormal; Notable for the following:    Streptococcus, Group A Screen (Direct) POSITIVE (*)    All other components within normal limits   Imaging Review No results found.   MDM   1. Allergic rhinitis   2. Sinusitis   3. Cough   4. Strep pharyngitis    Meds ordered this encounter  Medications  . cetirizine-pseudoephedrine (ZYRTEC-D) 5-120 MG per tablet    Sig: Take 1 tablet by mouth daily.    Dispense:  15 tablet    Refill:  0  . cefdinir (OMNICEF) 300 MG capsule    Sig: Take 1 capsule (300 mg total) by mouth 2 (two) times daily.    Dispense:  20 capsule    Refill:  0  . chlorpheniramine-HYDROcodone (TUSSIONEX PENNKINETIC ER) 10-8 MG/5ML LQCR    Sig: Take 5 mLs by mouth every 12 (twelve) hours as needed for cough.    Dispense:  115 mL    Refill:  0   Plan of care discussed with Dr. Juventino Slovak.  Patient to followup with his primary care provider.    Jacqualyn Posey, NP 08/16/13 [phone]

## 2013-08-19 NOTE — ED Provider Notes (Signed)
Medical screening examination/treatment/procedure(s) were performed by resident physician or non-physician practitioner and as supervising physician I was immediately available for consultation/collaboration.   Joelyn Lover DOUGLAS MD.   Jayziah Bankhead D Burel Kahre, MD 08/19/13 0841 

## 2013-09-08 ENCOUNTER — Telehealth: Payer: Self-pay | Admitting: Family Medicine

## 2013-09-08 ENCOUNTER — Other Ambulatory Visit: Payer: Self-pay | Admitting: Family Medicine

## 2013-09-08 NOTE — Telephone Encounter (Signed)
His med list does not reflect his lisinopril dose. Contact with the patient. Find out his current dosage and please give him 3 refills. Or second option is call [facility] find out what he is getting currently regarding lisinopril dosage and authorize 3 refills

## 2013-09-08 NOTE — Telephone Encounter (Signed)
Patient is having [facility] send over a refill request of his lisinopril. He usually gets this filled through [facility], but they are unable to get in touch with that doctor at this time and he is hoping Dr Nicki Reaper will fill one time.   [employer]

## 2013-09-09 ENCOUNTER — Other Ambulatory Visit: Payer: Self-pay | Admitting: *Deleted

## 2013-09-09 MED ORDER — LISINOPRIL 2.5 MG PO TABS: 2.5000 mg | ORAL_TABLET | Freq: Every day | ORAL | Status: DC

## 2013-09-09 NOTE — Telephone Encounter (Signed)
Pt taking lisinopril 2.5mg  one qd. #30 with 2 additional refills sent to [facility]. Pt notified.

## 2013-09-16 ENCOUNTER — Telehealth: Payer: Self-pay | Admitting: Family Medicine

## 2013-09-16 NOTE — Telephone Encounter (Signed)
°  PRADAXA 150 MG CAPS capsule   Please see chart for attached form on this med, needs refill by the end of this week if we can

## 2013-09-17 NOTE — Telephone Encounter (Signed)
Let the patient know we will do the best we can. This time year we l get multiple different patient's sending [place] the same type of thing. This is the result of insurance companies. We will do our best to try to get this completed quickly. Some insurance companies will cover a very similar medicine that works the same way and sometimes it requires switching to 1 that they will cover. Once again we will do our best. I will forward this to the person that does prior approval. (Note to Brendale: This patient has cardiomyopathy. His specialist at [facility] has him on this medicine. There are other similar drugs to it. But it's try to get this one approved first.)

## 2013-09-17 NOTE — Telephone Encounter (Signed)
Spoke with patient, explained that his best shot for getting this approved is to have the specialist that started the medication to submit letter to insurance for approval.  After reviewing what we have, I just do not feel confident that I can get this approved and explained to the patient that once it is denied then there's a lengthy appeal process, he states that he will be seeing the specialist next week that initiated the medicine and will discuss with them getting a letter submitted to try to get authorized, explained to the patient that I would be happy to do whatever I can to help I just feel that their office would have a better chance with getting approval since that doctor knows why that particular med was given to treat patient's condition

## 2013-09-23 DIAGNOSIS — Z95810 Presence of automatic (implantable) cardiac defibrillator: Secondary | ICD-10-CM | POA: Diagnosis not present

## 2013-09-23 DIAGNOSIS — J986 Disorders of diaphragm: Secondary | ICD-10-CM | POA: Diagnosis not present

## 2013-09-23 DIAGNOSIS — Q243 Pulmonary infundibular stenosis: Secondary | ICD-10-CM | POA: Diagnosis not present

## 2013-09-23 DIAGNOSIS — Z9889 Other specified postprocedural states: Secondary | ICD-10-CM | POA: Diagnosis not present

## 2013-09-23 DIAGNOSIS — Q203 Discordant ventriculoarterial connection: Secondary | ICD-10-CM | POA: Diagnosis not present

## 2013-10-02 DIAGNOSIS — J986 Disorders of diaphragm: Secondary | ICD-10-CM | POA: Insufficient documentation

## 2013-10-13 ENCOUNTER — Telehealth: Payer: Self-pay | Admitting: Family Medicine

## 2013-10-13 NOTE — Telephone Encounter (Signed)
Pt is going to have a procedure with a dentist on 10/16/13 there may be a cavity or other  Things done and with his blood thinner pradaxia being one of his meds does he need to stop it Before his appt, what orders would you like him to follow for this procedure/cleaning?

## 2013-10-13 NOTE — Telephone Encounter (Signed)
Nurse to call, under my understanding for simple cleanings and cavity fillings it is not necessary to stop the medication. He can double check with his cardiologist. If they were removing the tooth or doing any type of deep, surgery then he would need to come off the medicine a few days beforehand S

## 2013-10-14 NOTE — Telephone Encounter (Signed)
Patient notified and verbalized understanding. 

## 2013-10-24 ENCOUNTER — Telehealth: Payer: Self-pay | Admitting: Family Medicine

## 2013-10-24 NOTE — Telephone Encounter (Signed)
Patient will be discontinuing pradaxa and starting eliquis on Monday.

## 2013-10-24 NOTE — Telephone Encounter (Signed)
Nurse to call-I am not sure I understand the nature of this message please try to clarify then sent back to me thank you

## 2013-10-24 NOTE — Telephone Encounter (Signed)
See attached to the chart for a change in his med from pradaxa to eliquis

## 2013-11-06 ENCOUNTER — Other Ambulatory Visit: Payer: Self-pay | Admitting: Family Medicine

## 2013-11-27 ENCOUNTER — Other Ambulatory Visit: Payer: Self-pay | Admitting: Family Medicine

## 2013-12-04 ENCOUNTER — Other Ambulatory Visit: Payer: Self-pay | Admitting: Family Medicine

## 2013-12-23 DIAGNOSIS — I499 Cardiac arrhythmia, unspecified: Secondary | ICD-10-CM | POA: Diagnosis not present

## 2014-01-15 DIAGNOSIS — Q203 Discordant ventriculoarterial connection: Secondary | ICD-10-CM | POA: Diagnosis not present

## 2014-01-15 DIAGNOSIS — Z9889 Other specified postprocedural states: Secondary | ICD-10-CM | POA: Diagnosis not present

## 2014-01-15 DIAGNOSIS — Z95810 Presence of automatic (implantable) cardiac defibrillator: Secondary | ICD-10-CM | POA: Diagnosis not present

## 2014-01-15 DIAGNOSIS — Q243 Pulmonary infundibular stenosis: Secondary | ICD-10-CM | POA: Diagnosis not present

## 2014-01-29 ENCOUNTER — Other Ambulatory Visit: Payer: Self-pay | Admitting: Family Medicine

## 2014-02-17 ENCOUNTER — Ambulatory Visit (INDEPENDENT_AMBULATORY_CARE_PROVIDER_SITE_OTHER): Payer: Medicare Other | Admitting: Family Medicine

## 2014-02-17 ENCOUNTER — Encounter: Payer: Self-pay | Admitting: Family Medicine

## 2014-02-17 VITALS — BP 112/74 | Temp 98.4°F | Ht 71.0 in | Wt 260.0 lb

## 2014-02-17 DIAGNOSIS — B9789 Other viral agents as the cause of diseases classified elsewhere: Secondary | ICD-10-CM | POA: Diagnosis not present

## 2014-02-17 DIAGNOSIS — J029 Acute pharyngitis, unspecified: Secondary | ICD-10-CM

## 2014-02-17 DIAGNOSIS — B349 Viral infection, unspecified: Secondary | ICD-10-CM

## 2014-02-17 LAB — POCT RAPID STREP A (OFFICE): Rapid Strep A Screen: NEGATIVE

## 2014-02-17 NOTE — Progress Notes (Signed)
   Subjective:    Patient ID: Jeremy Goodman, male    DOB: 1980/10/30, 33 y.o.   MRN: [id number]  Sore Throat  This is a new problem. The current episode started yesterday. The problem has been unchanged. Neither side of throat is experiencing more pain than the other. There has been no fever. The pain is moderate. Associated symptoms include congestion and headaches. Associated symptoms comments: chills. Treatments tried: benadryl. The treatment provided no relief.   Patient states he has no other concerns at this time.   Patient has heart condition. Worried that this could lead to further issues he denies shortness of breath denies fevers nausea or vomiting  Review of Systems  HENT: Positive for congestion.   Neurological: Positive for headaches.       Objective:   Physical Exam  Throat mild erythema neck is supple eardrums normal lungs are clear heart regular murmur noted     Assessment & Plan:  Rapid strep test is negative backup sent No antibiotics currently. If this develops into a sinus infection patient can call for antibiotics.

## 2014-02-18 LAB — STREP A DNA PROBE: GASP: NEGATIVE

## 2014-02-20 ENCOUNTER — Telehealth: Payer: Self-pay | Admitting: Family Medicine

## 2014-02-20 MED ORDER — CEFPROZIL 500 MG PO TABS: 500.0000 mg | ORAL_TABLET | Freq: Two times a day (BID) | ORAL | Status: DC

## 2014-02-20 NOTE — Telephone Encounter (Signed)
May send then Cefzil 500 mg 1 twice a day, #20, 10 days, if not improved over the next 4-5 days or if worse right do recommend to followup

## 2014-02-20 NOTE — Telephone Encounter (Signed)
Discussed with patient. Med sent to pharm.  

## 2014-02-20 NOTE — Telephone Encounter (Signed)
Patient is now coughing worse and sore throat. He is also coughing up clear phlegm. It just started this morning and Dr. Nicki Reaper told him to call us if it got worse and we would call something else in.  [employer]

## 2014-02-20 NOTE — Telephone Encounter (Signed)
Seen on 02/17/14

## 2014-02-26 ENCOUNTER — Other Ambulatory Visit: Payer: Self-pay | Admitting: Family Medicine

## 2014-02-26 NOTE — Telephone Encounter (Signed)
Last seen 05/07/13

## 2014-03-27 ENCOUNTER — Other Ambulatory Visit: Payer: Self-pay | Admitting: Family Medicine

## 2014-04-07 DIAGNOSIS — Z95810 Presence of automatic (implantable) cardiac defibrillator: Secondary | ICD-10-CM | POA: Diagnosis not present

## 2014-04-07 DIAGNOSIS — Q243 Pulmonary infundibular stenosis: Secondary | ICD-10-CM | POA: Diagnosis not present

## 2014-04-07 DIAGNOSIS — Q203 Discordant ventriculoarterial connection: Secondary | ICD-10-CM | POA: Diagnosis not present

## 2014-04-07 DIAGNOSIS — Z9889 Other specified postprocedural states: Secondary | ICD-10-CM | POA: Diagnosis not present

## 2014-06-01 ENCOUNTER — Other Ambulatory Visit: Payer: Self-pay | Admitting: Family Medicine

## 2014-06-12 ENCOUNTER — Ambulatory Visit (INDEPENDENT_AMBULATORY_CARE_PROVIDER_SITE_OTHER): Payer: Medicare Other | Admitting: Nurse Practitioner

## 2014-06-12 ENCOUNTER — Encounter: Payer: Self-pay | Admitting: Nurse Practitioner

## 2014-06-12 VITALS — BP 102/80 | Temp 98.4°F | Ht 71.0 in | Wt 265.0 lb

## 2014-06-12 DIAGNOSIS — J0110 Acute frontal sinusitis, unspecified: Secondary | ICD-10-CM | POA: Diagnosis not present

## 2014-06-12 MED ORDER — CEFPROZIL 500 MG PO TABS: 500.0000 mg | ORAL_TABLET | Freq: Two times a day (BID) | ORAL | Status: DC

## 2014-06-17 ENCOUNTER — Encounter: Payer: Self-pay | Admitting: Nurse Practitioner

## 2014-06-17 DIAGNOSIS — Z95810 Presence of automatic (implantable) cardiac defibrillator: Secondary | ICD-10-CM | POA: Insufficient documentation

## 2014-06-17 NOTE — Progress Notes (Signed)
Subjective:  Presents for complaints of sinus symptoms over the past 3 days. Sneezing coughing and sore throat. Right frontal area headache. No ear pain. No wheezing. Taking Mucinex DM. Has been taking care of his sick father. Patient has a very complex history see chart.  Objective:   BP 102/80 mmHg  Temp(Src) 98.4 F (36.9 C) (Oral)  Ht 5\' 11"  (1.803 m)  Wt 265 lb (120.203 kg)  BMI 36.98 kg/m2 NAD. Alert, oriented. TMs clear effusion, no erythema. Pharynx injected with PND noted. Neck supple with mild soft anterior adenopathy. Lungs clear. Heart regular rate rhythm.  Assessment: Acute frontal sinusitis, recurrence not specified  Plan:  Meds ordered this encounter  Medications  . cefPROZIL (CEFZIL) 500 MG tablet    Sig: Take 1 tablet (500 mg total) by mouth 2 (two) times daily.    Dispense:  20 tablet    Refill:  0    Order Specific Question:  Supervising Provider    Answer:  Mikey Kirschner [[id number]]  . furosemide (LASIX) 20 MG tablet    Sig:    Continue Mucinex DM as directed or other medications as approved by his cardiologist. Call back if worsens or persists.

## 2014-06-23 DIAGNOSIS — I519 Heart disease, unspecified: Secondary | ICD-10-CM | POA: Diagnosis not present

## 2014-06-23 DIAGNOSIS — Z95810 Presence of automatic (implantable) cardiac defibrillator: Secondary | ICD-10-CM | POA: Diagnosis not present

## 2014-06-23 DIAGNOSIS — Q203 Discordant ventriculoarterial connection: Secondary | ICD-10-CM | POA: Diagnosis not present

## 2014-06-23 DIAGNOSIS — Z9889 Other specified postprocedural states: Secondary | ICD-10-CM | POA: Diagnosis not present

## 2014-07-03 ENCOUNTER — Other Ambulatory Visit: Payer: Self-pay | Admitting: Family Medicine

## 2014-07-13 IMAGING — CR DG CHEST 1V PORT
1 series · 1 of 1 positions shown · non-contrast
Comparison: Chest radiograph performed earlier today at [DATE] a.m.

CLINICAL DATA: Check endotracheal tube position.

PORTABLE CHEST - 1 VIEW

[AP]
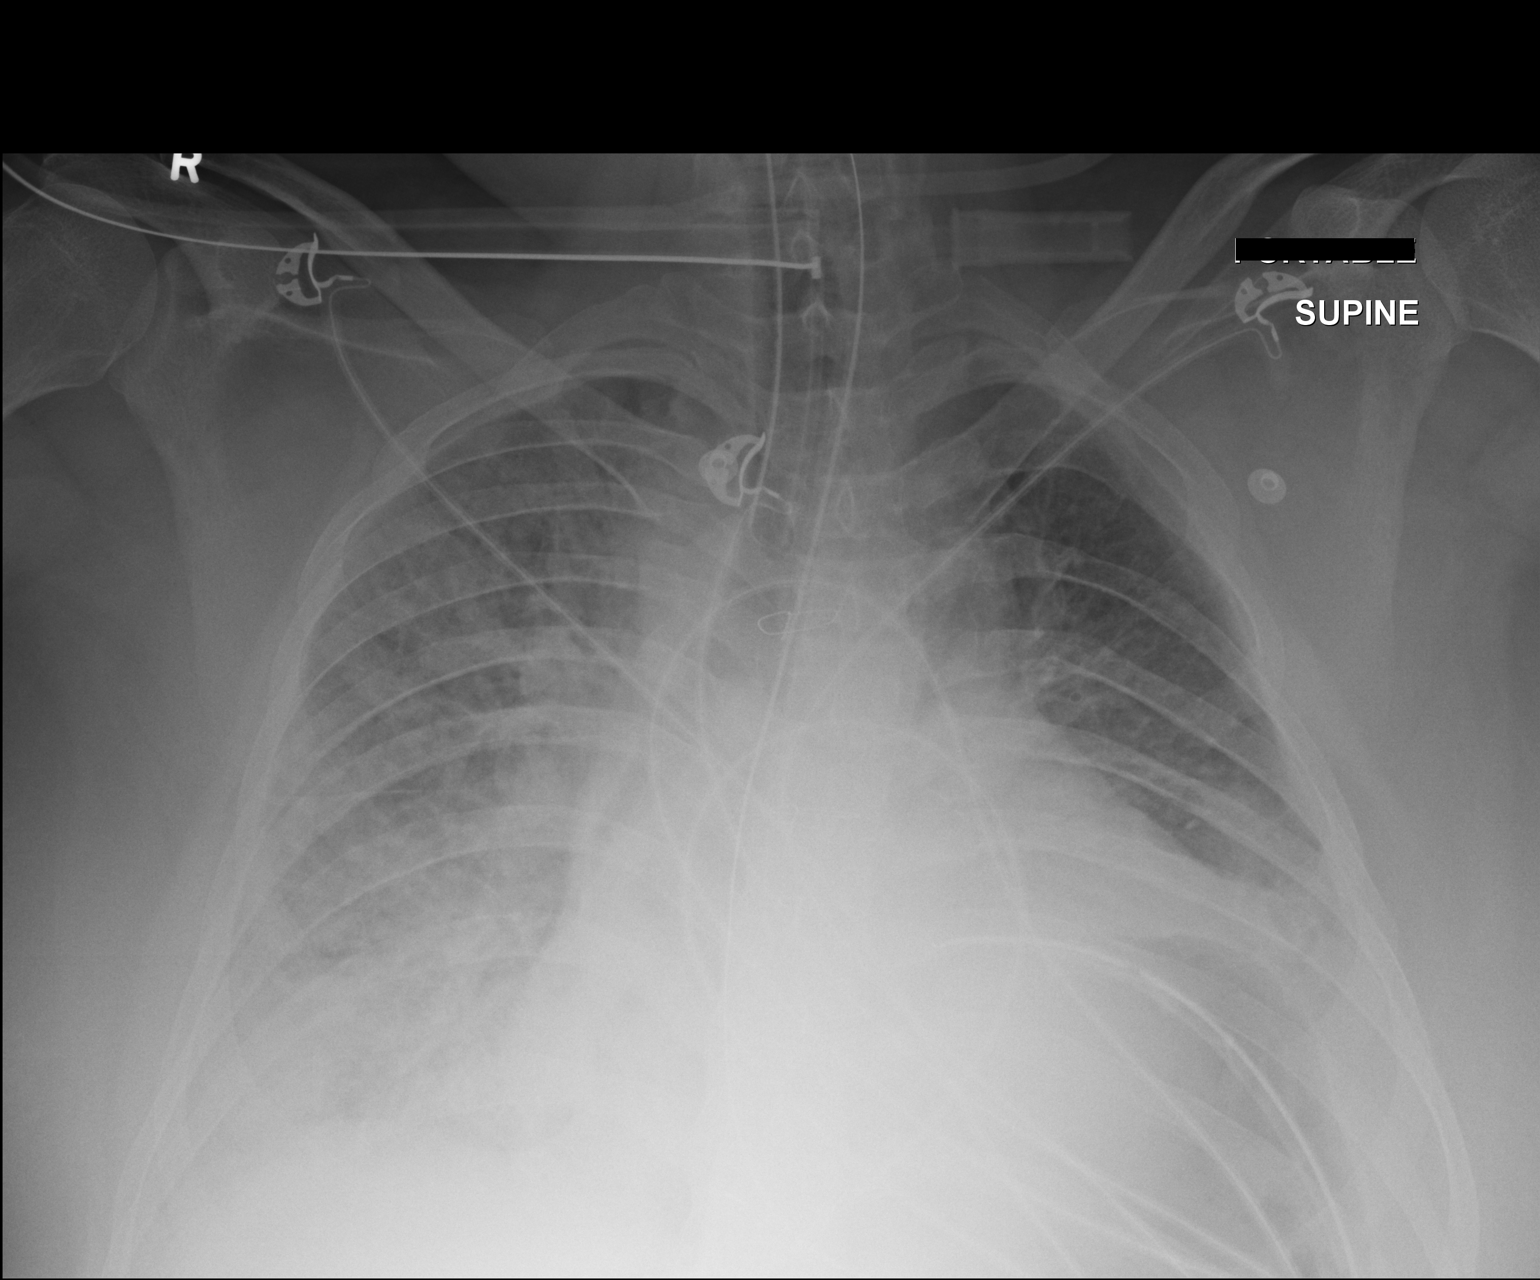

[1 of 1 positions shown; findings below may reference images not displayed]

FINDINGS: The patient's endotracheal tube is seen ending 3-4 cm
above the carina.  An enteric tube is seen extending below the
diaphragm.

There is persistent diffuse bilateral airspace opacification, which
may reflect mildly asymmetric pulmonary edema, right greater than
left, or possibly multifocal pneumonia.  There is elevation of the
left hemidiaphragm, as on the prior study.  Small bilateral pleural
effusions are likely present.  No pneumothorax is seen.

The cardiomediastinal silhouette is enlarged.  No acute osseous
abnormalities are identified.
IMPRESSION: 1.  Endotracheal tube seen ending 3-4 cm above the carina.
2.  Persistent diffuse bilateral airspace opacification, which may
reflect mildly asymmetric pulmonary edema, right greater than left,
or possibly multifocal pneumonia.  Likely small bilateral pleural
effusions.
3.  Cardiomegaly noted.

## 2014-07-13 IMAGING — CR DG CHEST 1V PORT
1 series · 1 of 1 positions shown · non-contrast
Comparison: Chest radiograph performed 09/02/2012

CLINICAL DATA: Respiratory distress.

PORTABLE CHEST - 1 VIEW

[AP]
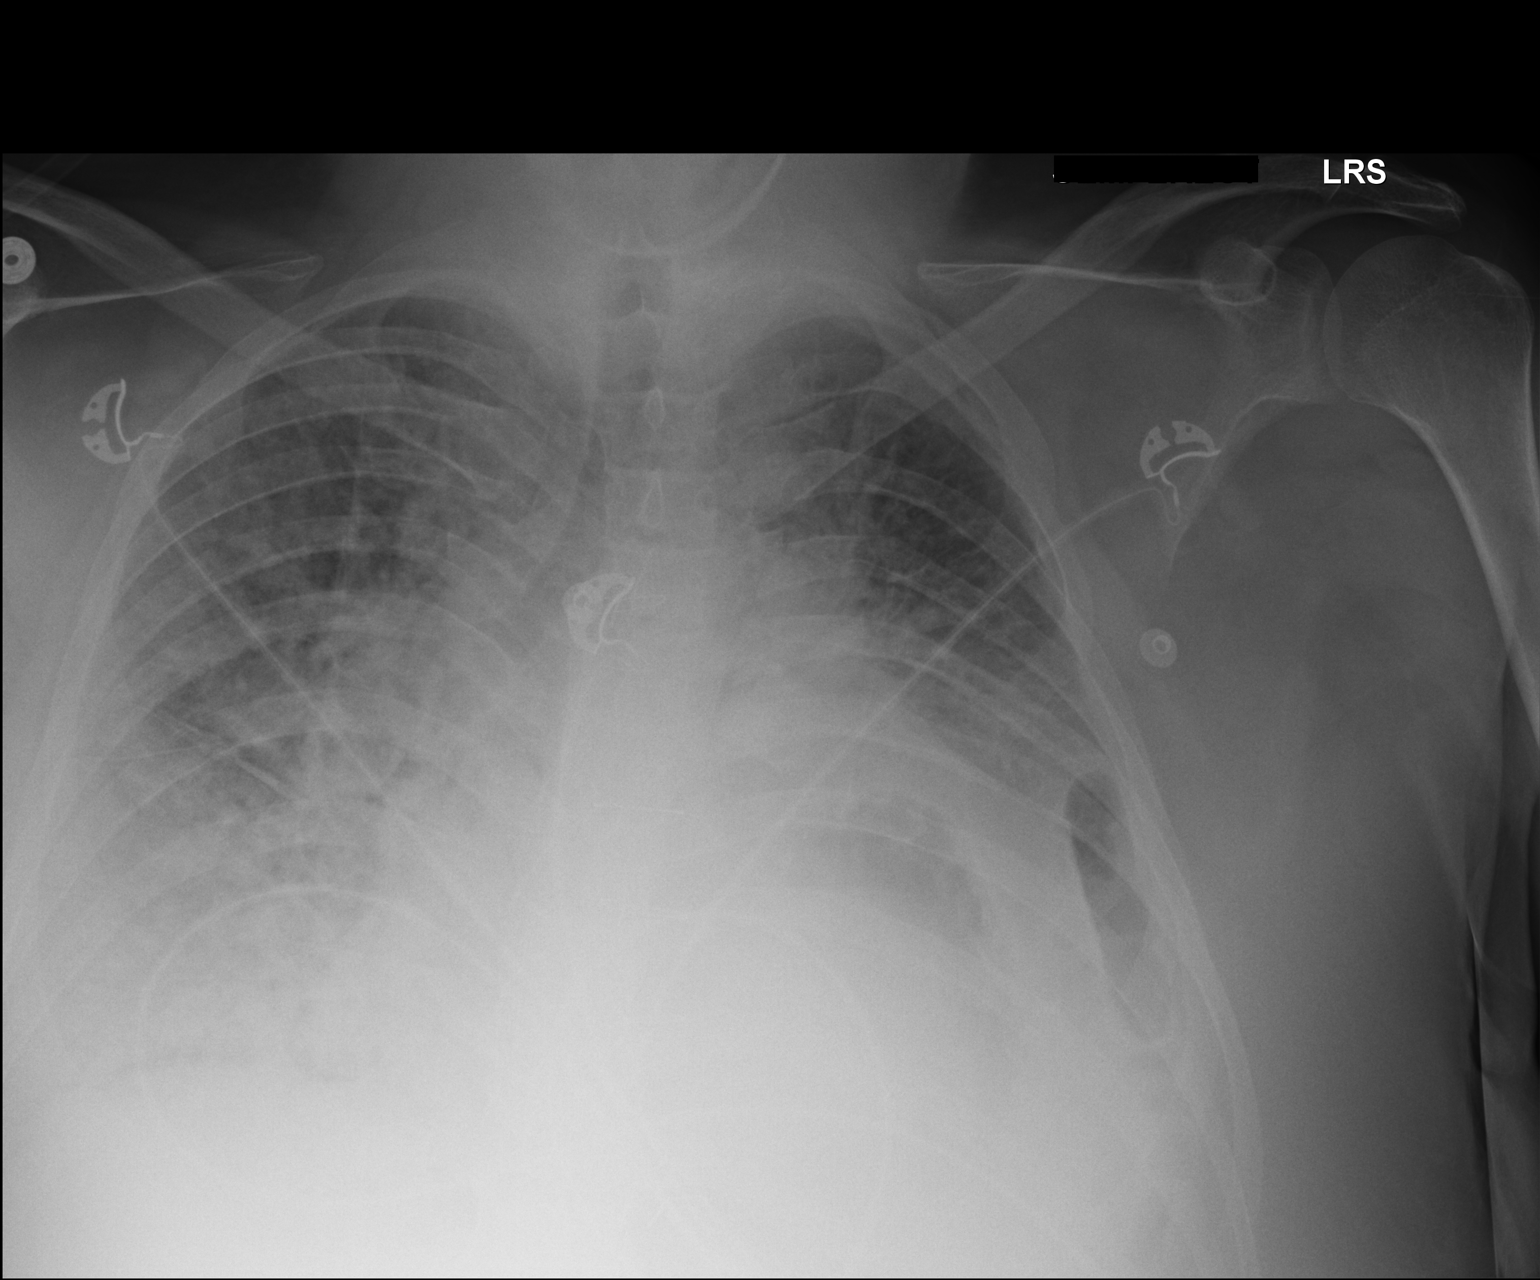

[1 of 1 positions shown; findings below may reference images not displayed]

FINDINGS: There appears to be significant elevation of the left
hemidiaphragm, better characterized than on the prior study.
Diffuse opacification of both lungs may reflect pulmonary edema or
possibly pneumonia.  Underlying vascular congestion is seen.  No
definite pleural effusion or pneumothorax is identified.

The cardiomediastinal silhouette is mildly enlarged.  No acute
osseous abnormalities are seen.
IMPRESSION: Diffuse opacification of both lungs may reflect pulmonary edema or
possibly pneumonia; underlying vascular congestion and mild
cardiomegaly noted.  Elevation of the left hemidiaphragm again
noted.

## 2014-08-03 ENCOUNTER — Other Ambulatory Visit: Payer: Self-pay | Admitting: Family Medicine

## 2014-08-21 ENCOUNTER — Ambulatory Visit (INDEPENDENT_AMBULATORY_CARE_PROVIDER_SITE_OTHER): Payer: Medicare Other | Admitting: Family Medicine

## 2014-08-21 ENCOUNTER — Encounter: Payer: Self-pay | Admitting: Family Medicine

## 2014-08-21 VITALS — BP 102/70 | Ht 71.0 in | Wt 285.2 lb

## 2014-08-21 DIAGNOSIS — R739 Hyperglycemia, unspecified: Secondary | ICD-10-CM | POA: Diagnosis not present

## 2014-08-21 DIAGNOSIS — E669 Obesity, unspecified: Secondary | ICD-10-CM | POA: Diagnosis not present

## 2014-08-21 DIAGNOSIS — Z23 Encounter for immunization: Secondary | ICD-10-CM | POA: Diagnosis not present

## 2014-08-21 DIAGNOSIS — Z7901 Long term (current) use of anticoagulants: Secondary | ICD-10-CM

## 2014-08-21 DIAGNOSIS — I10 Essential (primary) hypertension: Secondary | ICD-10-CM

## 2014-08-21 HISTORY — DX: Essential (primary) hypertension: I10

## 2014-08-21 LAB — HEMATOCRIT: HCT: 43.7 % (ref 39.0–52.0)

## 2014-08-21 LAB — HEMOGLOBIN A1C
Hgb A1c MFr Bld: 5.6 % (ref <5.7)
Mean Plasma Glucose: 114 mg/dL (ref <117)

## 2014-08-21 LAB — HEMOGLOBIN: Hemoglobin: 14.6 g/dL (ref 13.0–17.0)

## 2014-08-21 MED ORDER — METOPROLOL SUCCINATE ER 50 MG PO TB24: ORAL_TABLET | ORAL | Status: DC

## 2014-08-21 MED ORDER — LISINOPRIL 2.5 MG PO TABS: 2.5000 mg | ORAL_TABLET | Freq: Every day | ORAL | Status: DC

## 2014-08-21 MED ORDER — ESOMEPRAZOLE MAGNESIUM 40 MG PO CPDR: DELAYED_RELEASE_CAPSULE | ORAL | Status: DC

## 2014-08-21 NOTE — Progress Notes (Signed)
   Subjective:    Patient ID: Jeremy Goodman, male    DOB: Jun 23, 1981, 34 y.o.   MRN: [id number]  Hypertension This is a chronic problem. The current episode started more than 1 year ago. The problem has been gradually improving since onset. The problem is controlled. Pertinent negatives include no chest pain. There are no associated agents to hypertension. There are no known risk factors for coronary artery disease. Treatments tried: metoprolol. The current treatment provides significant improvement. There are no compliance problems.    patient's heart disease stable denies any tachycardia shortness breath swelling in the legs  reflexes been doing well as long as he takes medicine He states he's been gaining weight because he has not been watching how he eats because he's been under a lot of stress we talked about that at length   Has a history of prediabetes he does try to be careful about starches to some degree Patient states that he has no other concerns at this time.   25 minutes spent with patient covering multiple issus  Review of Systems  Constitutional: Negative for activity change, appetite change and fatigue.  HENT: Negative for congestion.   Respiratory: Negative for cough.   Cardiovascular: Negative for chest pain.  Gastrointestinal: Negative for abdominal pain.  Endocrine: Negative for polydipsia and polyphagia.  Neurological: Negative for weakness.  Psychiatric/Behavioral: Negative for confusion.       Objective:   Physical Exam  Constitutional: He appears well-nourished. No distress.  Cardiovascular: Normal rate, regular rhythm and normal heart sounds.   No murmur heard. Pulmonary/Chest: Effort normal and breath sounds normal. No respiratory distress.  Musculoskeletal: He exhibits no edema.  Lymphadenopathy:    He has no cervical adenopathy.  Neurological: He is alert.  Psychiatric: His behavior is normal.  Vitals reviewed.      patient was advised to go to  pharmacy for [place]:   we will check hemoglobin because of him being on blood thinner he denies any rectal bleeding   hypertension good control  reflux issues dietary measures medication discussed.   history of prediabetes check A1c  because of medications will check his metabolic 7 is well  history of congenital heart disease he will follow-up in 2 specialist next week  the importance of losing weight dietary measures and exercise was discussed in detail patient was encouraged to get his weight down at least 30 pounds

## 2014-08-22 LAB — BASIC METABOLIC PANEL
BUN: 14 mg/dL (ref 6–23)
CO2: 29 mEq/L (ref 19–32)
CREATININE: 0.79 mg/dL (ref 0.50–1.35)
Calcium: 9.8 mg/dL (ref 8.4–10.5)
Chloride: 99 mEq/L (ref 96–112)
Glucose, Bld: 82 mg/dL (ref 70–99)
POTASSIUM: 4.8 meq/L (ref 3.5–5.3)
Sodium: 137 mEq/L (ref 135–145)

## 2014-08-27 DIAGNOSIS — Z9889 Other specified postprocedural states: Secondary | ICD-10-CM | POA: Diagnosis not present

## 2014-08-27 DIAGNOSIS — Q243 Pulmonary infundibular stenosis: Secondary | ICD-10-CM | POA: Diagnosis not present

## 2014-08-27 DIAGNOSIS — Q203 Discordant ventriculoarterial connection: Secondary | ICD-10-CM | POA: Diagnosis not present

## 2014-11-24 DIAGNOSIS — I519 Heart disease, unspecified: Secondary | ICD-10-CM | POA: Diagnosis not present

## 2014-11-24 DIAGNOSIS — Z9889 Other specified postprocedural states: Secondary | ICD-10-CM | POA: Diagnosis not present

## 2014-11-24 DIAGNOSIS — Z8679 Personal history of other diseases of the circulatory system: Secondary | ICD-10-CM | POA: Diagnosis not present

## 2014-11-24 DIAGNOSIS — Z95810 Presence of automatic (implantable) cardiac defibrillator: Secondary | ICD-10-CM | POA: Diagnosis not present

## 2014-11-24 DIAGNOSIS — I4892 Unspecified atrial flutter: Secondary | ICD-10-CM | POA: Diagnosis not present

## 2015-02-21 DIAGNOSIS — Q249 Congenital malformation of heart, unspecified: Secondary | ICD-10-CM | POA: Diagnosis not present

## 2015-02-21 DIAGNOSIS — R000 Tachycardia, unspecified: Secondary | ICD-10-CM | POA: Diagnosis not present

## 2015-02-21 DIAGNOSIS — R002 Palpitations: Secondary | ICD-10-CM | POA: Diagnosis not present

## 2015-02-21 DIAGNOSIS — R51 Headache: Secondary | ICD-10-CM | POA: Diagnosis not present

## 2015-02-21 DIAGNOSIS — Z95810 Presence of automatic (implantable) cardiac defibrillator: Secondary | ICD-10-CM | POA: Diagnosis not present

## 2015-02-21 DIAGNOSIS — I482 Chronic atrial fibrillation: Secondary | ICD-10-CM | POA: Diagnosis not present

## 2015-02-21 DIAGNOSIS — Q289 Congenital malformation of circulatory system, unspecified: Secondary | ICD-10-CM | POA: Diagnosis not present

## 2015-02-21 DIAGNOSIS — Z6841 Body Mass Index (BMI) 40.0 and over, adult: Secondary | ICD-10-CM | POA: Diagnosis not present

## 2015-02-21 DIAGNOSIS — R410 Disorientation, unspecified: Secondary | ICD-10-CM | POA: Diagnosis not present

## 2015-02-21 DIAGNOSIS — Z881 Allergy status to other antibiotic agents status: Secondary | ICD-10-CM | POA: Diagnosis not present

## 2015-02-21 DIAGNOSIS — I5021 Acute systolic (congestive) heart failure: Secondary | ICD-10-CM | POA: Diagnosis not present

## 2015-02-21 DIAGNOSIS — I509 Heart failure, unspecified: Secondary | ICD-10-CM | POA: Diagnosis not present

## 2015-02-21 DIAGNOSIS — H9192 Unspecified hearing loss, left ear: Secondary | ICD-10-CM | POA: Diagnosis not present

## 2015-02-22 DIAGNOSIS — R002 Palpitations: Secondary | ICD-10-CM | POA: Diagnosis not present

## 2015-02-22 DIAGNOSIS — Q249 Congenital malformation of heart, unspecified: Secondary | ICD-10-CM | POA: Diagnosis not present

## 2015-02-24 ENCOUNTER — Encounter: Payer: Self-pay | Admitting: Family Medicine

## 2015-02-24 ENCOUNTER — Ambulatory Visit (INDEPENDENT_AMBULATORY_CARE_PROVIDER_SITE_OTHER): Payer: Medicare Other | Admitting: Family Medicine

## 2015-02-24 VITALS — BP 122/82 | Ht 71.0 in | Wt 299.2 lb

## 2015-02-24 DIAGNOSIS — R739 Hyperglycemia, unspecified: Secondary | ICD-10-CM

## 2015-02-24 LAB — POCT GLYCOSYLATED HEMOGLOBIN (HGB A1C): HEMOGLOBIN A1C: 5.2

## 2015-02-24 NOTE — Progress Notes (Signed)
   Subjective:    Patient ID: Jeremy Goodman, male    DOB: 1980/08/24, 34 y.o.   MRN: [id number]  Hypertension This is a chronic problem. The current episode started more than 1 year ago. Pertinent negatives include no chest pain. There are no compliance problems.     Patient in today for a medication check. Patient recently had a hospitalization on 7/10-7/11 patient was hospitalized for tachycardia. Patient was at [facility]. Patient states no other concerns. He denies any current problems with breathing denies chest tightness pressure pain Last Hemoglobin A1c was on 08/21/14 Todays Hemoglobinc A1c is: 5.2  Review of Systems  Constitutional: Negative for activity change, appetite change and fatigue.  HENT: Negative for congestion.   Respiratory: Negative for cough.   Cardiovascular: Negative for chest pain.  Gastrointestinal: Negative for abdominal pain.  Endocrine: Negative for polydipsia and polyphagia.  Neurological: Negative for weakness.  Psychiatric/Behavioral: Negative for confusion.       Objective:   Physical Exam  Constitutional: He appears well-nourished. No distress.  Cardiovascular: Normal rate, regular rhythm and normal heart sounds.   No murmur heard. Pulmonary/Chest: Effort normal and breath sounds normal. No respiratory distress.  Musculoskeletal: He exhibits no edema.  Lymphadenopathy:    He has no cervical adenopathy.  Neurological: He is alert.  Psychiatric: His behavior is normal.  Vitals reviewed.         Assessment & Plan:  HTN-decent control overall continue current medication this patient does have underlying heart disease he is seen the heart specialists at 2 tomorrow I talked with him at length about the importance of trying to lose weight and staying physically active  Hyperglycemia-A1c actually looks good no sign of diabetes  Recent hospitalization he seems to be doing good we'll wait the copy of the CAT scans his blood work looked good

## 2015-02-25 DIAGNOSIS — Z9889 Other specified postprocedural states: Secondary | ICD-10-CM | POA: Diagnosis not present

## 2015-02-25 DIAGNOSIS — Q203 Discordant ventriculoarterial connection: Secondary | ICD-10-CM | POA: Diagnosis not present

## 2015-03-01 ENCOUNTER — Ambulatory Visit: Payer: Medicare Other | Admitting: Family Medicine

## 2015-05-08 ENCOUNTER — Emergency Department (INDEPENDENT_AMBULATORY_CARE_PROVIDER_SITE_OTHER)
Admission: EM | Admit: 2015-05-08 | Discharge: 2015-05-08 | Disposition: A | Discharge status: Home / Self Care | Payer: Medicare Other | Source: Home / Self Care | Attending: Family Medicine | Admitting: Family Medicine

## 2015-05-08 ENCOUNTER — Encounter (HOSPITAL_COMMUNITY): Payer: Self-pay | Admitting: Emergency Medicine

## 2015-05-08 DIAGNOSIS — J302 Other seasonal allergic rhinitis: Secondary | ICD-10-CM | POA: Diagnosis not present

## 2015-05-08 LAB — POCT RAPID STREP A: STREPTOCOCCUS, GROUP A SCREEN (DIRECT): NEGATIVE

## 2015-05-08 MED ORDER — IPRATROPIUM BROMIDE 0.06 % NA SOLN: 2.0000 | Freq: Four times a day (QID) | NASAL | Status: DC

## 2015-05-08 NOTE — ED Provider Notes (Signed)
CSN: [id number]     Arrival date & time 05/08/15  1307 History   First MD Initiated Contact with Patient 05/08/15 1411     Chief Complaint  Patient presents with  . Sore Throat   (Consider location/radiation/quality/duration/timing/severity/associated sxs/prior Treatment) Patient is a 34 y.o. male presenting with pharyngitis. The history is provided by the patient.  Sore Throat This is a new problem. The current episode started 2 days ago. The problem has been gradually worsening. Pertinent negatives include no chest pain, no abdominal pain and no shortness of breath.    Past Medical History  Diagnosis Date  . Anginal pain   . CHF (congestive heart failure)   . Transposition of great vessel   . C. difficile diarrhea     2 weeks ago prior to admission 1/20 tx'ed with Flagyl   . HTN (hypertension) 08/21/2014   Past Surgical History  Procedure Laterality Date  . Coronary artery bypass graft    . Implantable cardioverter defibrillator implant     Family History  Problem Relation Age of Onset  . Atrial fibrillation Father   . Other Father     s/p pacemaker    Social History  Substance Use Topics  . Smoking status: Never Smoker   . Smokeless tobacco: None  . Alcohol Use: No    Review of Systems  Constitutional: Negative.   HENT: Positive for congestion, postnasal drip and rhinorrhea.   Respiratory: Negative for cough and shortness of breath.   Cardiovascular: Negative for chest pain.  Gastrointestinal: Negative for abdominal pain.  All other systems reviewed and are negative.   Allergies  Levaquin  Home Medications   Prior to Admission medications   Medication Sig Start Date End Date Taking? Authorizing Provider  acetaminophen (TYLENOL) 325 MG tablet Place 2 tablets (650 mg total) into feeding tube every 4 (four) hours as needed. 09/03/12   Brooke O Edmisten, PA-C  apixaban (ELIQUIS) 5 MG TABS tablet Take 5 mg by mouth 2 (two) times daily.    Historical Provider, MD   Dextromethorphan-Guaifenesin (MUCINEX DM MAXIMUM STRENGTH) 60-1200 MG TB12 Take by mouth as needed.    Historical Provider, MD  diphenhydrAMINE (SOMINEX) 25 MG tablet Take 25 mg by mouth at bedtime as needed for sleep.    Historical Provider, MD  esomeprazole (NEXIUM) 40 MG capsule TAKE 1 CAPSULE BY MOUTH EVERY DAY. 08/21/14   Kathyrn Drown, MD  furosemide (LASIX) 20 MG tablet  05/08/14   Historical Provider, MD  ipratropium (ATROVENT) 0.06 % nasal spray Place 2 sprays into both nostrils 4 (four) times daily. 05/08/15   Billy Fischer, MD  lisinopril (PRINIVIL,ZESTRIL) 2.5 MG tablet Take 1 tablet (2.5 mg total) by mouth daily. 08/21/14   Kathyrn Drown, MD  metoprolol succinate (TOPROL-XL) 50 MG 24 hr tablet TAKE 1 AND 1/2 TABLETS BY MOUTH EVERY DAY. 08/21/14   Kathyrn Drown, MD  simethicone (MYLICON) 494 MG chewable tablet 125 mg. Take 125 mg by mouth every 6 (six) hours as needed.    Historical Provider, MD  spironolactone (ALDACTONE) 25 MG tablet 25 mg. Take 1 tablet (25 mg total) by mouth daily. 10/30/12   Historical Provider, MD   Meds Ordered and Administered this Visit  Medications - No data to display  BP 124/88 mmHg  Pulse 79  Temp(Src) 98.8 F (37.1 C) (Oral)  Resp 20  SpO2 95% No data found.   Physical Exam  Constitutional: He is oriented to person, place, and time. He  appears well-developed and well-nourished.  HENT:  Head: Normocephalic.  Right Ear: External ear normal.  Left Ear: External ear normal.  Mouth/Throat: Oropharynx is clear and moist.  Eyes: Conjunctivae are normal. Pupils are equal, round, and reactive to light.  Neck: Normal range of motion. Neck supple.  Lymphadenopathy:    He has no cervical adenopathy.  Neurological: He is alert and oriented to person, place, and time.  Skin: Skin is warm and dry.  Nursing note and vitals reviewed.   ED Course  Procedures (including critical care time)  Labs Review Labs Reviewed  POCT RAPID STREP A   Strep  negImaging Review No results found.   Visual Acuity Review  Right Eye Distance:   Left Eye Distance:   Bilateral Distance:    Right Eye Near:   Left Eye Near:    Bilateral Near:         MDM   1. Seasonal allergic rhinitis       Billy Fischer, MD 05/08/15 1445

## 2015-05-08 NOTE — ED Notes (Signed)
C/o sore throat since Thursday night.  Reports cough, stuffy nose, headache, clear phlegm.  Patient heart history and concerned for strep throat

## 2015-05-10 ENCOUNTER — Ambulatory Visit (INDEPENDENT_AMBULATORY_CARE_PROVIDER_SITE_OTHER): Payer: Medicare Other | Admitting: Family Medicine

## 2015-05-10 VITALS — BP 124/86 | Temp 98.4°F | Ht 71.0 in | Wt 293.4 lb

## 2015-05-10 DIAGNOSIS — B9689 Other specified bacterial agents as the cause of diseases classified elsewhere: Secondary | ICD-10-CM

## 2015-05-10 DIAGNOSIS — J019 Acute sinusitis, unspecified: Secondary | ICD-10-CM | POA: Diagnosis not present

## 2015-05-10 MED ORDER — CEFPROZIL 500 MG PO TABS: 500.0000 mg | ORAL_TABLET | Freq: Two times a day (BID) | ORAL | Status: DC

## 2015-05-10 NOTE — Progress Notes (Signed)
   Subjective:    Patient ID: Jeremy Goodman, male    DOB: Jan 13, 1981, 34 y.o.   MRN: [id number]  Sinusitis This is a new problem. The current episode started in the past 7 days. The problem has been gradually worsening since onset. Associated symptoms include congestion, coughing, headaches and a sore throat. Pertinent negatives include no ear pain. Past treatments include spray decongestants, acetaminophen and oral decongestants (Mucinex DM, Tylenol, Atrovent). The treatment provided no relief.   Patient states no other concerns this visit. Patient has been gaining weight. States she could do better dietary. Does have cardiac condition followed by specialist  Review of Systems  Constitutional: Negative for fever and activity change.  HENT: Positive for congestion, rhinorrhea and sore throat. Negative for ear pain.   Eyes: Negative for discharge.  Respiratory: Positive for cough. Negative for wheezing.   Cardiovascular: Negative for chest pain.  Neurological: Positive for headaches.       Objective:   Physical Exam  Constitutional: He appears well-developed.  HENT:  Head: Normocephalic.  Mouth/Throat: Oropharynx is clear and moist. No oropharyngeal exudate.  Neck: Normal range of motion.  Cardiovascular: Normal rate, regular rhythm and normal heart sounds.   No murmur heard. Pulmonary/Chest: Effort normal and breath sounds normal. He has no wheezes.  Lymphadenopathy:    He has no cervical adenopathy.  Neurological: He exhibits normal muscle tone.  Skin: Skin is warm and dry.  Nursing note and vitals reviewed.         Assessment & Plan:  Viral syndrome secondary sinus infection antibiotics prescribed warning signs discuss  Cardiac patient was encouraged to lose weight.

## 2015-05-11 LAB — CULTURE, GROUP A STREP: STREP A CULTURE: POSITIVE — AB

## 2015-05-11 NOTE — ED Notes (Signed)
Final report of strep testing positive for group A strep. Discussed w Arlana Hove , who authorized amoxicillin 500 mg, PO, BID x 10 days. Called patient to discuss, and he stated he had been to see his PCP, and has started a Rx for his worsening symptoms, and was placed on Ceftin. Was asked to contact his PCP and inquire w them if this would cover strep as well, as we did not wish to place him on another Rx if it was not needed. Patient voiced understanding of the plan

## 2015-05-25 ENCOUNTER — Ambulatory Visit (INDEPENDENT_AMBULATORY_CARE_PROVIDER_SITE_OTHER): Payer: Medicare Other | Admitting: *Deleted

## 2015-05-25 DIAGNOSIS — Z23 Encounter for immunization: Secondary | ICD-10-CM

## 2015-07-13 ENCOUNTER — Other Ambulatory Visit: Payer: Self-pay | Admitting: Family Medicine

## 2015-07-26 ENCOUNTER — Ambulatory Visit (INDEPENDENT_AMBULATORY_CARE_PROVIDER_SITE_OTHER): Payer: Medicare Other | Admitting: Nurse Practitioner

## 2015-07-26 ENCOUNTER — Encounter: Payer: Self-pay | Admitting: Nurse Practitioner

## 2015-07-26 VITALS — BP 118/76 | Temp 99.3°F | Ht 71.0 in | Wt 306.0 lb

## 2015-07-26 DIAGNOSIS — J0120 Acute ethmoidal sinusitis, unspecified: Secondary | ICD-10-CM | POA: Diagnosis not present

## 2015-07-26 DIAGNOSIS — J020 Streptococcal pharyngitis: Secondary | ICD-10-CM | POA: Diagnosis not present

## 2015-07-26 LAB — POCT RAPID STREP A (OFFICE): RAPID STREP A SCREEN: POSITIVE — AB

## 2015-07-26 MED ORDER — CEFPROZIL 500 MG PO TABS: 500.0000 mg | ORAL_TABLET | Freq: Two times a day (BID) | ORAL | Status: DC

## 2015-07-26 NOTE — Progress Notes (Signed)
Subjective:  Presents for c/o sore throat, cough and congestion that began 3 days ago. No fever. Right frontal /ethmoid area headache. Runny nose. Worsening cough. Right ear pain. No wheezing. No vomiting diarrhea or abdominal pain. Taking fluids well. Voiding normal limit.  Objective:   BP 118/76 mmHg  Temp(Src) 99.3 F (37.4 C) (Oral)  Ht 5\' 11"  (1.803 m)  Wt 306 lb (138.801 kg)  BMI 42.70 kg/m2 NAD. Alert, oriented. TMs clear effusion, no erythema. Pharynx mildly erythematous. Neck supple with mild soft anterior adenopathy. Lungs clear. Heart regular rate rhythm. Results for orders placed or performed in visit on 07/26/15  POCT rapid strep A  Result Value Ref Range   Rapid Strep A Screen Positive (A) Negative     Assessment: Strep pharyngitis - Plan: POCT rapid strep A  Acute ethmoidal sinusitis, recurrence not specified  Plan:  Meds ordered this encounter  Medications  . cefPROZIL (CEFZIL) 500 MG tablet    Sig: Take 1 tablet (500 mg total) by mouth 2 (two) times daily.    Dispense:  20 tablet    Refill:  0    Order Specific Question:  Supervising Provider    Answer:  Mikey Kirschner [[id number]]   OTC meds as directed for congestion and cough. Callback to the end of the week if no improvement, sooner if worse.

## 2015-08-12 ENCOUNTER — Telehealth: Payer: Self-pay | Admitting: Nurse Practitioner

## 2015-08-12 NOTE — Telephone Encounter (Signed)
TCNA to get more info. Seen 12/12. Positive for strep. Prescribed cefzil.

## 2015-08-12 NOTE — Telephone Encounter (Signed)
Seen by Hoyle Sauer 12/12   Pt states he is having issues with his sinuses again, sore throat, cough, congestion Same as last time, not long off antibiotics wants another round called in.     [place]

## 2015-08-13 ENCOUNTER — Other Ambulatory Visit: Payer: Self-pay | Admitting: Nurse Practitioner

## 2015-08-13 MED ORDER — AMOXICILLIN-POT CLAVULANATE 875-125 MG PO TABS: 1.0000 | ORAL_TABLET | Freq: Two times a day (BID) | ORAL | Status: DC

## 2015-08-13 NOTE — Telephone Encounter (Signed)
Finished cefzil. Got better. 3 days ago started having sore throat, nasal congestion, hoarse cough, sinus headache, no fever, no wheezing.

## 2015-08-13 NOTE — Telephone Encounter (Signed)
Sent is another course of antibiotics; make sure he is taking his meds for congestion. Call back if persists.

## 2015-08-13 NOTE — Telephone Encounter (Signed)
Notified patient Jeremy Goodman sent in another course of antibiotics; make sure he is taking his meds for congestion. Call back if persists. Patient verbalized understanding.

## 2015-08-20 ENCOUNTER — Telehealth: Payer: Self-pay | Admitting: Family Medicine

## 2015-08-20 MED ORDER — CLARITHROMYCIN 500 MG PO TABS: ORAL_TABLET | ORAL | Status: DC

## 2015-08-20 NOTE — Telephone Encounter (Signed)
Patient called with symptoms of sore throat, cough, sinus headache, congestion. Patient recently seen on 07/26/15 for strep. Prescribed Cefzil. Patient called back on 08/12/15 with re occuring symptoms. Prescribed Augmentin. Patient called stating today lingering symptoms. Discussed in real time with Dr.Steve Luking. Received orders for Biaxin 500 mg BID for 10 days. Follow up if symptoms worsen. Patient verbalized understanding.

## 2015-08-23 DIAGNOSIS — Z95810 Presence of automatic (implantable) cardiac defibrillator: Secondary | ICD-10-CM | POA: Diagnosis not present

## 2015-08-23 DIAGNOSIS — I4892 Unspecified atrial flutter: Secondary | ICD-10-CM | POA: Diagnosis not present

## 2015-08-23 DIAGNOSIS — I517 Cardiomegaly: Secondary | ICD-10-CM | POA: Diagnosis not present

## 2015-08-25 ENCOUNTER — Encounter: Payer: Self-pay | Admitting: Family Medicine

## 2015-08-25 ENCOUNTER — Ambulatory Visit (INDEPENDENT_AMBULATORY_CARE_PROVIDER_SITE_OTHER): Payer: Medicare Other | Admitting: Family Medicine

## 2015-08-25 ENCOUNTER — Ambulatory Visit (HOSPITAL_COMMUNITY)
Admission: RE | Admit: 2015-08-25 | Discharge: 2015-08-25 | Disposition: A | Discharge status: Home / Self Care | Payer: Medicare Other | Source: Ambulatory Visit | Attending: Family Medicine | Admitting: Family Medicine

## 2015-08-25 VITALS — BP 110/80 | Ht 71.0 in | Wt 315.2 lb

## 2015-08-25 DIAGNOSIS — R739 Hyperglycemia, unspecified: Secondary | ICD-10-CM | POA: Diagnosis not present

## 2015-08-25 DIAGNOSIS — M79671 Pain in right foot: Secondary | ICD-10-CM | POA: Diagnosis not present

## 2015-08-25 DIAGNOSIS — Q203 Discordant ventriculoarterial connection: Secondary | ICD-10-CM | POA: Diagnosis not present

## 2015-08-25 DIAGNOSIS — Z6841 Body Mass Index (BMI) 40.0 and over, adult: Secondary | ICD-10-CM

## 2015-08-25 DIAGNOSIS — Z79899 Other long term (current) drug therapy: Secondary | ICD-10-CM | POA: Diagnosis not present

## 2015-08-25 DIAGNOSIS — M25561 Pain in right knee: Secondary | ICD-10-CM | POA: Diagnosis not present

## 2015-08-25 DIAGNOSIS — W000XXA Fall on same level due to ice and snow, initial encounter: Secondary | ICD-10-CM | POA: Diagnosis not present

## 2015-08-25 DIAGNOSIS — I10 Essential (primary) hypertension: Secondary | ICD-10-CM

## 2015-08-25 DIAGNOSIS — E785 Hyperlipidemia, unspecified: Secondary | ICD-10-CM | POA: Diagnosis not present

## 2015-08-25 NOTE — Progress Notes (Signed)
Subjective:    Patient ID: Jeremy Goodman, male    DOB: Feb 26, 1981, 35 y.o.   MRN: [id number]  Hypertension This is a chronic problem. The current episode started more than 1 year ago. The problem has been gradually improving since onset. Pertinent negatives include no chest pain. There are no associated agents to hypertension. There are no known risk factors for coronary artery disease. Treatments tried: metoprolol. The current treatment provides moderate improvement. There are no compliance problems.    Patient states that he had a fall about 1 month ago and he is still having some right knee and right foot pain.  Patient relates he landed on his right knee in his right foot in his left leg went out also. Despite one month later of taking it easy having severe pain in the knee down the tibial torsion also having significant discomfort in the forefoot. No calf pain no thigh pain.  Morbid obesity patient was counseled regarding the importance of physical activity watching calories minimizing snack foods. Healthy eating as well.  Congenital heart disease with history of CHF patient stable currently no signs of CHF heart with murmur but rate is controlled patient denies any shortness of breath PND denies chest heaviness tightness for tachycardia  Review of Systems  Constitutional: Negative for activity change, appetite change and fatigue.  HENT: Negative for congestion.   Respiratory: Negative for cough.   Cardiovascular: Negative for chest pain.  Gastrointestinal: Negative for abdominal pain.  Endocrine: Negative for polydipsia and polyphagia.  Neurological: Negative for weakness.  Psychiatric/Behavioral: Negative for confusion.       Objective:   Physical Exam  Constitutional: He appears well-nourished. No distress.  Cardiovascular: Normal rate, regular rhythm and normal heart sounds.   No murmur heard. Pulmonary/Chest: Effort normal and breath sounds normal. No respiratory distress.    Musculoskeletal: He exhibits no edema.  Lymphadenopathy:    He has no cervical adenopathy.  Neurological: He is alert.  Psychiatric: His behavior is normal.  Vitals reviewed.   Patient has tenderness in the right tibial portion of the knee in addition to this forefoot tenderness some hematoma noted on the knee as well 25 minutes was spent with the patient. Greater than half the time was spent in discussion and answering questions and counseling regarding the issues that the patient came in for today.      Assessment & Plan:  1. Essential hypertension Blood pressure good control healthy diet recommended minimize salt continue medication check lab work  2. Hyperlipidemia Watch fats in the diet exercise lose weight check lipid panel - Lipid panel  3. Hyperglycemia At risk for diabetes hyperglycemia in the past check A1c - Hemoglobin A1c  4. Right knee pain Right knee hematoma from a fall one month ago check x-ray - DG Knee Complete 4 Views Right  5. Right foot pain Foot tenderness from a fall one month ago check x-ray - DG Foot Complete Right  6. High risk medication use Lab work indicated. Because of medications. - Hepatic function panel - Basic metabolic panel  7. Morbid obesity, unspecified obesity type ([place]) Importance of watching diet losing weight over the next 6 months discuss with patient  8. Body mass index (BMI) of 40.1-44.9 in adult [facility]) The importance of losing weight discuss in regards to lessen the risk of heart failure. Keep appointment with follow-up specialist see [place] back in 6 months  9. Complete transposition of great vessels Stable heart condition currently follow-up with specialists see [place]  in 6 months  25 minutes was spent with the patient. Greater than half the time was spent in discussion and answering questions and counseling regarding the issues that the patient came in for today.

## 2015-08-25 NOTE — Patient Instructions (Signed)

## 2015-08-26 ENCOUNTER — Encounter: Payer: Self-pay | Admitting: Family Medicine

## 2015-08-26 DIAGNOSIS — R7303 Prediabetes: Secondary | ICD-10-CM | POA: Insufficient documentation

## 2015-08-26 LAB — BASIC METABOLIC PANEL
BUN/Creatinine Ratio: 16 (ref 8–19)
BUN: 13 mg/dL (ref 6–20)
CALCIUM: 9.4 mg/dL (ref 8.7–10.2)
CHLORIDE: 99 mmol/L (ref 96–106)
CO2: 22 mmol/L (ref 18–29)
Creatinine, Ser: 0.82 mg/dL (ref 0.76–1.27)
GFR calc Af Amer: 135 mL/min/{1.73_m2} (ref >59)
GFR, EST NON AFRICAN AMERICAN: 117 mL/min/{1.73_m2} (ref >59)
Glucose: 93 mg/dL (ref 65–99)
POTASSIUM: 4.2 mmol/L (ref 3.5–5.2)
Sodium: 138 mmol/L (ref 134–144)

## 2015-08-26 LAB — LIPID PANEL
CHOLESTEROL TOTAL: 165 mg/dL (ref 100–199)
Chol/HDL Ratio: 4.2 ratio units (ref 0.0–5.0)
HDL: 39 mg/dL — ABNORMAL LOW (ref >39)
LDL CALC: 110 mg/dL — AB (ref 0–99)
Triglycerides: 82 mg/dL (ref 0–149)
VLDL CHOLESTEROL CAL: 16 mg/dL (ref 5–40)

## 2015-08-26 LAB — HEMOGLOBIN A1C
ESTIMATED AVERAGE GLUCOSE: 120 mg/dL
Hgb A1c MFr Bld: 5.8 % — ABNORMAL HIGH (ref 4.8–5.6)

## 2015-08-26 LAB — HEPATIC FUNCTION PANEL
ALBUMIN: 4.4 g/dL (ref 3.5–5.5)
ALT: 19 IU/L (ref 0–44)
AST: 21 IU/L (ref 0–40)
Alkaline Phosphatase: 83 IU/L (ref 39–117)
Bilirubin Total: 0.5 mg/dL (ref 0.0–1.2)
Bilirubin, Direct: 0.16 mg/dL (ref 0.00–0.40)
Total Protein: 7.8 g/dL (ref 6.0–8.5)

## 2015-08-27 ENCOUNTER — Telehealth: Payer: Self-pay | Admitting: Family Medicine

## 2015-08-27 NOTE — Telephone Encounter (Signed)
Calling to check on results to blood work and x-ray on foot.

## 2015-08-29 NOTE — Telephone Encounter (Signed)
Foot x-ray normal, please see message regarding lab work, lab work message was sent via my chart apparently patient didn't see this? Nurse's-please relate to the patient x-ray negative and the message on the lab work.

## 2015-08-30 NOTE — Telephone Encounter (Signed)
Results discussed with patient. Patient advised Foot x-ray normal, please see message regarding lab work, lab work message was sent via my chart. Patient verbalized understanding and stated he will read his message about labs in my chart.

## 2015-11-04 ENCOUNTER — Ambulatory Visit (INDEPENDENT_AMBULATORY_CARE_PROVIDER_SITE_OTHER): Payer: Medicare Other | Admitting: Family Medicine

## 2015-11-04 ENCOUNTER — Encounter: Payer: Self-pay | Admitting: Family Medicine

## 2015-11-04 VITALS — Temp 98.2°F | Ht 71.0 in | Wt 324.6 lb

## 2015-11-04 DIAGNOSIS — J019 Acute sinusitis, unspecified: Secondary | ICD-10-CM | POA: Diagnosis not present

## 2015-11-04 DIAGNOSIS — B9689 Other specified bacterial agents as the cause of diseases classified elsewhere: Secondary | ICD-10-CM

## 2015-11-04 MED ORDER — OSELTAMIVIR PHOSPHATE 75 MG PO CAPS: 75.0000 mg | ORAL_CAPSULE | Freq: Two times a day (BID) | ORAL | Status: DC

## 2015-11-04 MED ORDER — AMOXICILLIN 500 MG PO TABS: 500.0000 mg | ORAL_TABLET | Freq: Three times a day (TID) | ORAL | Status: DC

## 2015-11-04 NOTE — Progress Notes (Signed)
   Subjective:    Patient ID: Jeremy Goodman, male    DOB: 07/28/1981, 35 y.o.   MRN: [id number]  Cough This is a new problem. The current episode started yesterday. Associated symptoms include headaches, nasal congestion, rhinorrhea and a sore throat. Pertinent negatives include no chest pain, ear pain, fever or wheezing. Treatments tried: mucinex.   Patient with sinus pressure drainage discomfort present over the past few days denies wheezing difficulty breathing Has history of heart disease  Review of Systems  Constitutional: Negative for fever and activity change.  HENT: Positive for congestion, rhinorrhea and sore throat. Negative for ear pain.   Eyes: Negative for discharge.  Respiratory: Positive for cough. Negative for wheezing.   Cardiovascular: Negative for chest pain.  Neurological: Positive for headaches.       Objective:   Physical Exam  Constitutional: He appears well-developed.  HENT:  Head: Normocephalic.  Mouth/Throat: Oropharynx is clear and moist. No oropharyngeal exudate.  Neck: Normal range of motion.  Cardiovascular: Normal rate, regular rhythm and normal heart sounds.   No murmur heard. Pulmonary/Chest: Effort normal and breath sounds normal. He has no wheezes.  Lymphadenopathy:    He has no cervical adenopathy.  Neurological: He exhibits normal muscle tone.  Skin: Skin is warm and dry.  Nursing note and vitals reviewed.         Assessment & Plan:  Viral illness Secondary rhinosinusitis Patient was seen today for upper respiratory illness. It is felt that the patient is dealing with sinusitis. Antibiotics were prescribed today. Importance of compliance with medication was discussed. Symptoms should gradually resolve over the course of the next several days. If high fevers, progressive illness, difficulty breathing, worsening condition or failure for symptoms to improve over the next several days then the patient is to follow-up. If any emergent  conditions the patient is to follow-up in the emergency department otherwise to follow-up in the office. I did give the patient a prescription Tamiflu because of his underlying health history he is at high risk of complication should he have trouble therefore he would need to immediately get Tamiflu filled should he start having high fever chills body aches

## 2015-11-04 NOTE — Patient Instructions (Signed)

## 2015-11-23 DIAGNOSIS — Z95810 Presence of automatic (implantable) cardiac defibrillator: Secondary | ICD-10-CM | POA: Diagnosis not present

## 2015-11-23 DIAGNOSIS — I4892 Unspecified atrial flutter: Secondary | ICD-10-CM | POA: Diagnosis not present

## 2015-11-25 ENCOUNTER — Encounter: Payer: Self-pay | Admitting: Family Medicine

## 2015-11-25 ENCOUNTER — Ambulatory Visit (INDEPENDENT_AMBULATORY_CARE_PROVIDER_SITE_OTHER): Payer: Medicare Other | Admitting: Family Medicine

## 2015-11-25 VITALS — BP 110/70 | Temp 98.1°F | Ht 71.0 in | Wt 325.0 lb

## 2015-11-25 DIAGNOSIS — N390 Urinary tract infection, site not specified: Secondary | ICD-10-CM

## 2015-11-25 DIAGNOSIS — R309 Painful micturition, unspecified: Secondary | ICD-10-CM

## 2015-11-25 LAB — POCT URINALYSIS DIPSTICK
RBC UA: NEGATIVE
SPEC GRAV UA: 1.025
pH, UA: 5

## 2015-11-25 MED ORDER — SULFAMETHOXAZOLE-TRIMETHOPRIM 800-160 MG PO TABS: 1.0000 | ORAL_TABLET | Freq: Two times a day (BID) | ORAL | Status: DC

## 2015-11-25 NOTE — Progress Notes (Signed)
   Subjective:    Patient ID: Jeremy Goodman, male    DOB: 25-Apr-1981, 35 y.o.   MRN: [id number]  Urinary Tract Infection  This is a new problem. The current episode started 1 to 4 weeks ago. The problem occurs intermittently. The quality of the pain is described as burning. There has been no fever. Associated symptoms comments: Painful urination. He has tried increased fluids for the symptoms.   Patient states no other concerns this visit.  Results for orders placed or performed in visit on 11/25/15  POCT urinalysis dipstick  Result Value Ref Range   Color, UA Dark Yellow    Clarity, UA Clear    Glucose, UA     Bilirubin, UA     Ketones, UA     Spec Grav, UA 1.025    Blood, UA Negative    pH, UA 5.0    Protein, UA     Urobilinogen, UA     Nitrite, UA     Leukocytes, UA small (1+) (A) Negative   Sometimes burns and aoetimes burns afterwards  Odor sat times  No new pain in low abd or low back  positive history of urinary tract infection past Review of Systems No headache, no major weight loss or weight gain, no chest pain no back pain abdominal pain no change in bowel habits complete ROS otherwise negative     Objective:   Physical Exam  alert vital stable no acute distress HEENT normal lungs clear heart regular in rhythm no [place]   urinalysis 6-10 white blood cells per high-power field (      Assessment & Plan:   impression urinary tract infection plan antibiotics prescribed. Symptom care discussed warning signs discussed

## 2015-12-13 ENCOUNTER — Encounter: Payer: Self-pay | Admitting: Family Medicine

## 2015-12-13 ENCOUNTER — Ambulatory Visit (INDEPENDENT_AMBULATORY_CARE_PROVIDER_SITE_OTHER): Payer: Medicare Other | Admitting: Family Medicine

## 2015-12-13 VITALS — BP 100/72 | Temp 98.0°F | Ht 71.0 in | Wt 317.5 lb

## 2015-12-13 DIAGNOSIS — N3000 Acute cystitis without hematuria: Secondary | ICD-10-CM

## 2015-12-13 DIAGNOSIS — R42 Dizziness and giddiness: Secondary | ICD-10-CM | POA: Diagnosis not present

## 2015-12-13 DIAGNOSIS — R300 Dysuria: Secondary | ICD-10-CM | POA: Diagnosis not present

## 2015-12-13 LAB — POCT URINALYSIS DIPSTICK: pH, UA: 5

## 2015-12-13 MED ORDER — CEPHALEXIN 500 MG PO CAPS: 500.0000 mg | ORAL_CAPSULE | Freq: Four times a day (QID) | ORAL | Status: DC

## 2015-12-13 NOTE — Progress Notes (Signed)
   Subjective:    Patient ID: Jeremy Goodman, male    DOB: Dec 07, 1980, 35 y.o.   MRN: [id number]  Dysuria  This is a new problem. The current episode started 1 to 4 weeks ago. The problem occurs intermittently. The problem has been unchanged. The quality of the pain is described as burning. The pain is moderate. He has tried nothing for the symptoms. The treatment provided no relief.   Results for orders placed or performed in visit on 12/13/15  POCT urinalysis dipstick  Result Value Ref Range   Color, UA     Clarity, UA     Glucose, UA     Bilirubin, UA     Ketones, UA     Spec Grav, UA >=1.030    Blood, UA     pH, UA 5.0    Protein, UA     Urobilinogen, UA     Nitrite, UA     Leukocytes, UA small (1+) (A) Negative   Patient states that he has no other concerns at this time.   denies high fever chills sweats denies chest pressure. Denies shortness of breath  Review of Systems  Genitourinary: Positive for dysuria.    patient sometimes dizzy when he stands up.    Objective:   Physical Exam  lungs clear heart regular blood pressure taken laying sitting comparable abdomen soft UA with occasional WBC       Assessment & Plan:   urine culture ordered Antibiotics for the next week  dizziness nonspecific no sign of orthostasis

## 2015-12-16 LAB — URINE CULTURE

## 2016-01-31 ENCOUNTER — Ambulatory Visit (INDEPENDENT_AMBULATORY_CARE_PROVIDER_SITE_OTHER): Payer: Medicare Other | Admitting: Family Medicine

## 2016-01-31 ENCOUNTER — Encounter: Payer: Self-pay | Admitting: Family Medicine

## 2016-01-31 VITALS — BP 118/76 | Temp 98.9°F | Ht 71.0 in | Wt 315.0 lb

## 2016-01-31 DIAGNOSIS — J019 Acute sinusitis, unspecified: Secondary | ICD-10-CM

## 2016-01-31 DIAGNOSIS — B9689 Other specified bacterial agents as the cause of diseases classified elsewhere: Secondary | ICD-10-CM

## 2016-01-31 DIAGNOSIS — J029 Acute pharyngitis, unspecified: Secondary | ICD-10-CM

## 2016-01-31 LAB — POCT RAPID STREP A (OFFICE): RAPID STREP A SCREEN: NEGATIVE

## 2016-01-31 MED ORDER — AMOXICILLIN 500 MG PO TABS: 500.0000 mg | ORAL_TABLET | Freq: Three times a day (TID) | ORAL | Status: DC

## 2016-01-31 NOTE — Progress Notes (Signed)
   Subjective:    Patient ID: Jeremy Goodman, male    DOB: Oct 22, 1980, 35 y.o.   MRN: [id number]  Sinusitis This is a new problem. Episode onset: 2 weeks. Associated symptoms include congestion, coughing, headaches and a sore throat. Treatments tried: benadryl, mucinex dm.  Patient relates sinus pressure congestion drainage coughing some sore throat denies high fever chills sweats  Review of Systems  HENT: Positive for congestion and sore throat.   Respiratory: Positive for cough.   Neurological: Positive for headaches.       Objective:   Physical Exam  Throat nonerythematous neck supple lungs clear heart regular moderate sinus tenderness  Negative for strep    Assessment & Plan:  Patient was seen today for upper respiratory illness. It is felt that the patient is dealing with sinusitis. Antibiotics were prescribed today. Importance of compliance with medication was discussed. Symptoms should gradually resolve over the course of the next several days. If high fevers, progressive illness, difficulty breathing, worsening condition or failure for symptoms to improve over the next several days then the patient is to follow-up. If any emergent conditions the patient is to follow-up in the emergency department otherwise to follow-up in the office.

## 2016-02-09 ENCOUNTER — Other Ambulatory Visit: Payer: Self-pay | Admitting: Family Medicine

## 2016-02-22 ENCOUNTER — Encounter: Payer: Self-pay | Admitting: Family Medicine

## 2016-02-22 ENCOUNTER — Ambulatory Visit (INDEPENDENT_AMBULATORY_CARE_PROVIDER_SITE_OTHER): Payer: Medicare Other | Admitting: Family Medicine

## 2016-02-22 VITALS — BP 130/82 | Ht 71.0 in | Wt 317.2 lb

## 2016-02-22 DIAGNOSIS — R7303 Prediabetes: Secondary | ICD-10-CM

## 2016-02-22 DIAGNOSIS — Q203 Discordant ventriculoarterial connection: Secondary | ICD-10-CM

## 2016-02-22 DIAGNOSIS — E785 Hyperlipidemia, unspecified: Secondary | ICD-10-CM | POA: Diagnosis not present

## 2016-02-22 DIAGNOSIS — I10 Essential (primary) hypertension: Secondary | ICD-10-CM

## 2016-02-22 MED ORDER — LISINOPRIL 2.5 MG PO TABS: 2.5000 mg | ORAL_TABLET | Freq: Every day | ORAL | Status: DC

## 2016-02-22 MED ORDER — METOPROLOL SUCCINATE ER 50 MG PO TB24: ORAL_TABLET | ORAL | Status: DC

## 2016-02-22 MED ORDER — NEXIUM 40 MG PO CPDR
DELAYED_RELEASE_CAPSULE | ORAL | Status: DC
Start: 2016-02-22 — End: 2016-09-14

## 2016-02-22 NOTE — Progress Notes (Signed)
   Subjective:    Patient ID: Jeremy Goodman, male    DOB: [birth year], 35 y.o.   MRN: [id number]  Hypertension This is a chronic problem. The current episode started in the past 7 days. Pertinent negatives include no chest pain, headaches or shortness of breath. Treatments tried: lasix, lisinopril, metoprolol, aldactone.   Patient has morbid obesity he does not watch what he eats he knows he needs to do a better job he has been cutting back on sodas does not exercise on a regular basis  Has congenital heart disease is on a blood thinner not having any problems with bleeding in he is aware of the warning signs  He does have prediabetes he understands the increased weight increases his risk of diabetes  Hyperlipidemia previous labs reviewed with patient he is aware that he needs watch diet lose weight and recheck his lab work   Review of Systems  Constitutional: Negative for fever, activity change and fatigue.  Respiratory: Negative for cough and shortness of breath.   Cardiovascular: Negative for chest pain and leg swelling.  Neurological: Negative for headaches.       Objective:   Physical Exam  Constitutional: He appears well-nourished. No distress.  Cardiovascular: Normal rate, regular rhythm and normal heart sounds.   No murmur heard. Pulmonary/Chest: Effort normal and breath sounds normal. No respiratory distress.  Musculoskeletal: He exhibits no edema.  Lymphadenopathy:    He has no cervical adenopathy.  Neurological: He is alert.  Psychiatric: His behavior is normal.  Vitals reviewed.   25 minutes was spent with the patient. Greater than half the time was spent in discussion and answering questions and counseling regarding the issues that the patient came in for today.       Assessment & Plan:  Blood pressure good control continue current measures prescriptions given Patient on Eliquis no sign of any bleeding issues. Continue this as per specialist direction Patient  was encouraged to lose weight watch diet exercise to improve his long-term health Prediabetes previous lab work reviewed a she does need additional lab work watch diet try to lose weight Hyperlipidemia previous lab work reviewed watch diet try to lose weight Work ordered

## 2016-02-23 LAB — BASIC METABOLIC PANEL
BUN / CREAT RATIO: 16 (ref 9–20)
BUN: 15 mg/dL (ref 6–20)
CHLORIDE: 99 mmol/L (ref 96–106)
CO2: 21 mmol/L (ref 18–29)
Calcium: 9.7 mg/dL (ref 8.7–10.2)
Creatinine, Ser: 0.92 mg/dL (ref 0.76–1.27)
GFR calc Af Amer: 126 mL/min/{1.73_m2} (ref >59)
GFR calc non Af Amer: 109 mL/min/{1.73_m2} (ref >59)
GLUCOSE: 86 mg/dL (ref 65–99)
POTASSIUM: 4.5 mmol/L (ref 3.5–5.2)
SODIUM: 142 mmol/L (ref 134–144)

## 2016-02-23 LAB — HEMOGLOBIN A1C
Est. average glucose Bld gHb Est-mCnc: 111 mg/dL
HEMOGLOBIN A1C: 5.5 % (ref 4.8–5.6)

## 2016-02-23 LAB — LIPID PANEL
CHOL/HDL RATIO: 4.2 ratio (ref 0.0–5.0)
Cholesterol, Total: 160 mg/dL (ref 100–199)
HDL: 38 mg/dL — AB (ref >39)
LDL CALC: 106 mg/dL — AB (ref 0–99)
Triglycerides: 79 mg/dL (ref 0–149)
VLDL CHOLESTEROL CAL: 16 mg/dL (ref 5–40)

## 2016-03-31 ENCOUNTER — Other Ambulatory Visit: Payer: Self-pay | Admitting: *Deleted

## 2016-03-31 MED ORDER — LISINOPRIL 2.5 MG PO TABS: 2.5000 mg | ORAL_TABLET | Freq: Every day | ORAL | 1 refills | Status: DC

## 2016-06-09 ENCOUNTER — Telehealth: Payer: Self-pay | Admitting: Family Medicine

## 2016-06-09 NOTE — Telephone Encounter (Signed)
Notified patient Dr. Nicki Reaper would recommend low residue diet that is mainly foods that are e.g. to digest such as mashed potatoes breath soft fruit soft vegetables. Lean meats only. Avoid all fried foods.  Avoid fatty and heavy foods. May use Imodium 1 to 2 tablets at a time up to 4 times a day as needed for diarrhea if ongoing troubles recommend follow-up. Patient verbalized understanding.

## 2016-06-09 NOTE — Telephone Encounter (Signed)
Diarrhea since Wednesday night. Went about 5 - 6 times since yesterday. Blood when wiping one time. But he also has a hemorroid. No mucus. abd pain on Wednesday on right side but none since, no fever.

## 2016-06-09 NOTE — Telephone Encounter (Signed)
I would recommend low refuse diet that is mainly foods that are e.g. to digest such as mashed potatoes breath soft bruit soft vegetables. Lean meats only. Avoid all fried foods.  Avoid fatty and heavy foods. May use Imodium 1 to 2 tablets at a time up to 4 times a day as needed for diarrhea if ongoing troubles recommend follow-up

## 2016-06-09 NOTE — Telephone Encounter (Signed)
Patient has been dealing with a stomach virus since yesterday morning with off and on diarrhea.  He wants to know what we recommend he do to treat the symptoms?

## 2016-07-03 ENCOUNTER — Other Ambulatory Visit: Payer: Self-pay | Admitting: Family Medicine

## 2016-08-03 ENCOUNTER — Other Ambulatory Visit: Payer: Self-pay | Admitting: Family Medicine

## 2016-08-24 ENCOUNTER — Ambulatory Visit (INDEPENDENT_AMBULATORY_CARE_PROVIDER_SITE_OTHER): Payer: Medicare Other | Admitting: Family Medicine

## 2016-08-24 ENCOUNTER — Encounter: Payer: Self-pay | Admitting: Family Medicine

## 2016-08-24 VITALS — BP 120/80 | Ht 71.0 in | Wt 320.5 lb

## 2016-08-24 DIAGNOSIS — R7303 Prediabetes: Secondary | ICD-10-CM | POA: Diagnosis not present

## 2016-08-24 DIAGNOSIS — I10 Essential (primary) hypertension: Secondary | ICD-10-CM | POA: Diagnosis not present

## 2016-08-24 DIAGNOSIS — Z23 Encounter for immunization: Secondary | ICD-10-CM

## 2016-08-24 DIAGNOSIS — E784 Other hyperlipidemia: Secondary | ICD-10-CM | POA: Diagnosis not present

## 2016-08-24 DIAGNOSIS — R229 Localized swelling, mass and lump, unspecified: Secondary | ICD-10-CM

## 2016-08-24 DIAGNOSIS — E7849 Other hyperlipidemia: Secondary | ICD-10-CM

## 2016-08-24 DIAGNOSIS — I471 Supraventricular tachycardia: Secondary | ICD-10-CM

## 2016-08-24 MED ORDER — METOPROLOL SUCCINATE ER 50 MG PO TB24: ORAL_TABLET | ORAL | 2 refills | Status: DC

## 2016-08-24 MED ORDER — LISINOPRIL 2.5 MG PO TABS: 2.5000 mg | ORAL_TABLET | Freq: Every day | ORAL | 2 refills | Status: DC

## 2016-08-24 NOTE — Progress Notes (Signed)
   Subjective:    Patient ID: Jeremy Goodman, male    DOB: [birth year], 36 y.o.   MRN: [id number]  Hypertension  This is a chronic problem. The current episode started more than 1 year ago. The problem has been gradually improving since onset. Associated symptoms include palpitations and shortness of breath. Pertinent negatives include no chest pain. There are no associated agents to hypertension. There are no known risk factors for coronary artery disease. Treatments tried: lisinopril, metoprolol. The current treatment provides moderate improvement. There are no compliance problems.    Patient has no concerns today.  Patient has history of congenital heart disease with previous surgery  He has significant trouble with obesity patient has been counseled numerous times to watch diet stay active and try to lose weight. His weight is constantly up.  Patient relates that occasionally his heart rate goes up but does not stay sustained up. He is been seen by specialist he is on metoprolol. He is followed by cardiology on a yearly basis  A shunt has a skin nodule on his right shoulder that has given him some trouble present for over year seems to be getting bigger does not drain any  Review of Systems  Constitutional: Negative for fatigue and fever.  HENT: Negative for congestion.   Respiratory: Positive for shortness of breath. Negative for cough.   Cardiovascular: Positive for palpitations. Negative for chest pain.  Gastrointestinal: Negative for abdominal pain.       Objective:   Physical Exam  Constitutional: He appears well-nourished. No distress.  Cardiovascular: Normal rate, regular rhythm and normal heart sounds.   No murmur heard. Pulmonary/Chest: Effort normal and breath sounds normal. No respiratory distress.  Musculoskeletal: He exhibits no edema.  Lymphadenopathy:    He has no cervical adenopathy.  Neurological: He is alert.  Psychiatric: His behavior is normal.  Vitals  reviewed.         Assessment & Plan:  Skin nodule-referral for dermatology probable removal  SVT-intermittent metoprolol doing a good job continue this  HTN good control  History congenital heart disease flu vaccine today  Morbid obesity patient was counseled to lose weight exercise minimize calories bring weight down  Follow-up in 6 months  Prediabetes at high risk of diabetes lab work ordered  Hyperlipidemia previous labs reviewed Sort of watch diet

## 2016-08-25 ENCOUNTER — Encounter: Payer: Self-pay | Admitting: Family Medicine

## 2016-08-25 LAB — BASIC METABOLIC PANEL
BUN / CREAT RATIO: 15 (ref 9–20)
BUN: 14 mg/dL (ref 6–20)
CHLORIDE: 96 mmol/L (ref 96–106)
CO2: 25 mmol/L (ref 18–29)
Calcium: 9.9 mg/dL (ref 8.7–10.2)
Creatinine, Ser: 0.91 mg/dL (ref 0.76–1.27)
GFR, EST AFRICAN AMERICAN: 128 mL/min/{1.73_m2} (ref >59)
GFR, EST NON AFRICAN AMERICAN: 110 mL/min/{1.73_m2} (ref >59)
Glucose: 84 mg/dL (ref 65–99)
POTASSIUM: 4.4 mmol/L (ref 3.5–5.2)
SODIUM: 139 mmol/L (ref 134–144)

## 2016-08-25 LAB — HEPATIC FUNCTION PANEL
ALT: 22 IU/L (ref 0–44)
AST: 23 IU/L (ref 0–40)
Albumin: 4.5 g/dL (ref 3.5–5.5)
Alkaline Phosphatase: 104 IU/L (ref 39–117)
Bilirubin Total: 0.9 mg/dL (ref 0.0–1.2)
Bilirubin, Direct: 0.22 mg/dL (ref 0.00–0.40)
Total Protein: 8.3 g/dL (ref 6.0–8.5)

## 2016-08-25 LAB — HEMOGLOBIN A1C
Est. average glucose Bld gHb Est-mCnc: 120 mg/dL
HEMOGLOBIN A1C: 5.8 % — AB (ref 4.8–5.6)

## 2016-08-25 LAB — LIPID PANEL
CHOL/HDL RATIO: 4.9 ratio (ref 0.0–5.0)
CHOLESTEROL TOTAL: 177 mg/dL (ref 100–199)
HDL: 36 mg/dL — AB (ref >39)
LDL CALC: 123 mg/dL — AB (ref 0–99)
TRIGLYCERIDES: 88 mg/dL (ref 0–149)
VLDL CHOLESTEROL CAL: 18 mg/dL (ref 5–40)

## 2016-09-14 ENCOUNTER — Other Ambulatory Visit: Payer: Self-pay | Admitting: *Deleted

## 2016-09-14 MED ORDER — DEXILANT 60 MG PO CPDR: 60.0000 mg | DELAYED_RELEASE_CAPSULE | Freq: Every day | ORAL | 2 refills | Status: DC

## 2016-11-03 ENCOUNTER — Other Ambulatory Visit: Payer: Self-pay | Admitting: Family Medicine

## 2016-12-12 DIAGNOSIS — D485 Neoplasm of uncertain behavior of skin: Secondary | ICD-10-CM | POA: Diagnosis not present

## 2016-12-12 DIAGNOSIS — D2361 Other benign neoplasm of skin of right upper limb, including shoulder: Secondary | ICD-10-CM | POA: Diagnosis not present

## 2017-01-02 DIAGNOSIS — Z9889 Other specified postprocedural states: Secondary | ICD-10-CM | POA: Diagnosis not present

## 2017-01-02 DIAGNOSIS — Z6841 Body Mass Index (BMI) 40.0 and over, adult: Secondary | ICD-10-CM | POA: Diagnosis not present

## 2017-01-02 DIAGNOSIS — I484 Atypical atrial flutter: Secondary | ICD-10-CM | POA: Diagnosis not present

## 2017-01-02 DIAGNOSIS — Z8679 Personal history of other diseases of the circulatory system: Secondary | ICD-10-CM | POA: Diagnosis not present

## 2017-01-02 DIAGNOSIS — I4892 Unspecified atrial flutter: Secondary | ICD-10-CM | POA: Diagnosis not present

## 2017-01-02 DIAGNOSIS — Z95810 Presence of automatic (implantable) cardiac defibrillator: Secondary | ICD-10-CM | POA: Diagnosis not present

## 2017-02-08 DIAGNOSIS — Z95810 Presence of automatic (implantable) cardiac defibrillator: Secondary | ICD-10-CM | POA: Diagnosis not present

## 2017-02-08 DIAGNOSIS — Q203 Discordant ventriculoarterial connection: Secondary | ICD-10-CM | POA: Diagnosis not present

## 2017-02-08 DIAGNOSIS — Z9889 Other specified postprocedural states: Secondary | ICD-10-CM | POA: Diagnosis not present

## 2017-02-08 DIAGNOSIS — Q243 Pulmonary infundibular stenosis: Secondary | ICD-10-CM | POA: Diagnosis not present

## 2017-02-21 ENCOUNTER — Encounter: Payer: Self-pay | Admitting: Family Medicine

## 2017-02-21 ENCOUNTER — Ambulatory Visit (INDEPENDENT_AMBULATORY_CARE_PROVIDER_SITE_OTHER): Payer: Medicare Other | Admitting: Family Medicine

## 2017-02-21 VITALS — BP 132/82 | Ht 71.0 in | Wt 323.4 lb

## 2017-02-21 DIAGNOSIS — I480 Paroxysmal atrial fibrillation: Secondary | ICD-10-CM | POA: Diagnosis not present

## 2017-02-21 DIAGNOSIS — E784 Other hyperlipidemia: Secondary | ICD-10-CM | POA: Diagnosis not present

## 2017-02-21 DIAGNOSIS — R600 Localized edema: Secondary | ICD-10-CM

## 2017-02-21 DIAGNOSIS — R300 Dysuria: Secondary | ICD-10-CM | POA: Diagnosis not present

## 2017-02-21 DIAGNOSIS — D649 Anemia, unspecified: Secondary | ICD-10-CM

## 2017-02-21 DIAGNOSIS — I5042 Chronic combined systolic (congestive) and diastolic (congestive) heart failure: Secondary | ICD-10-CM | POA: Diagnosis not present

## 2017-02-21 DIAGNOSIS — E7849 Other hyperlipidemia: Secondary | ICD-10-CM

## 2017-02-21 LAB — POCT URINALYSIS DIPSTICK
SPEC GRAV UA: 1.02 (ref 1.010–1.025)
pH, UA: 6 (ref 5.0–8.0)

## 2017-02-21 NOTE — Patient Instructions (Signed)
DASH Eating Plan DASH stands for "Dietary Approaches to Stop Hypertension." The DASH eating plan is a healthy eating plan that has been shown to reduce high blood pressure (hypertension). It may also reduce your risk for type 2 diabetes, heart disease, and stroke. The DASH eating plan may also help with weight loss. What are tips for following this plan? General guidelines  Avoid eating more than 2,300 mg (milligrams) of salt (sodium) a day. If you have hypertension, you may need to reduce your sodium intake to 1,500 mg a day.  Limit alcohol intake to no more than 1 drink a day for nonpregnant women and 2 drinks a day for men. One drink equals 12 oz of beer, 5 oz of wine, or 1 oz of hard liquor.  Work with your health care provider to maintain a healthy body weight or to lose weight. Ask what an ideal weight is for you.  Get at least 30 minutes of exercise that causes your heart to beat faster (aerobic exercise) most days of the week. Activities may include walking, swimming, or biking.  Work with your health care provider or diet and nutrition specialist (dietitian) to adjust your eating plan to your individual calorie needs. Reading food labels  Check food labels for the amount of sodium per serving. Choose foods with less than 5 percent of the Daily Value of sodium. Generally, foods with less than 300 mg of sodium per serving fit into this eating plan.  To find whole grains, look for the word "whole" as the first word in the ingredient list. Shopping  Buy products labeled as "low-sodium" or "no salt added."  Buy fresh foods. Avoid canned foods and premade or frozen meals. Cooking  Avoid adding salt when cooking. Use salt-free seasonings or herbs instead of table salt or sea salt. Check with your health care provider or pharmacist before using salt substitutes.  Do not fry foods. Cook foods using healthy methods such as baking, boiling, grilling, and broiling instead.  Cook with  heart-healthy oils, such as olive, canola, soybean, or sunflower oil. Meal planning   Eat a balanced diet that includes: ? 5 or more servings of fruits and vegetables each day. At each meal, try to fill half of your plate with fruits and vegetables. ? Up to 6-8 servings of whole grains each day. ? Less than 6 oz of lean meat, poultry, or fish each day. A 3-oz serving of meat is about the same size as a deck of cards. One egg equals 1 oz. ? 2 servings of low-fat dairy each day. ? A serving of nuts, seeds, or beans 5 times each week. ? Heart-healthy fats. Healthy fats called Omega-3 fatty acids are found in foods such as flaxseeds and coldwater fish, like sardines, salmon, and mackerel.  Limit how much you eat of the following: ? Canned or prepackaged foods. ? Food that is high in trans fat, such as fried foods. ? Food that is high in saturated fat, such as fatty meat. ? Sweets, desserts, sugary drinks, and other foods with added sugar. ? Full-fat dairy products.  Do not salt foods before eating.  Try to eat at least 2 vegetarian meals each week.  Eat more home-cooked food and less restaurant, buffet, and fast food.  When eating at a restaurant, ask that your food be prepared with less salt or no salt, if possible. What foods are recommended? The items listed may not be a complete list. Talk with your dietitian about what   dietary choices are best for you. Grains Whole-grain or whole-wheat bread. Whole-grain or whole-wheat pasta. Brown rice. Oatmeal. Quinoa. Bulgur. Whole-grain and low-sodium cereals. Pita bread. Low-fat, low-sodium crackers. Whole-wheat flour tortillas. Vegetables Fresh or frozen vegetables (raw, steamed, roasted, or grilled). Low-sodium or reduced-sodium tomato and vegetable juice. Low-sodium or reduced-sodium tomato sauce and tomato paste. Low-sodium or reduced-sodium canned vegetables. Fruits All fresh, dried, or frozen fruit. Canned fruit in natural juice (without  added sugar). Meat and other protein foods Skinless chicken or turkey. Ground chicken or turkey. Pork with fat trimmed off. Fish and seafood. Egg whites. Dried beans, peas, or lentils. Unsalted nuts, nut butters, and seeds. Unsalted canned beans. Lean cuts of beef with fat trimmed off. Low-sodium, lean deli meat. Dairy Low-fat (1%) or fat-free (skim) milk. Fat-free, low-fat, or reduced-fat cheeses. Nonfat, low-sodium ricotta or cottage cheese. Low-fat or nonfat yogurt. Low-fat, low-sodium cheese. Fats and oils Soft margarine without trans fats. Vegetable oil. Low-fat, reduced-fat, or light mayonnaise and salad dressings (reduced-sodium). Canola, safflower, olive, soybean, and sunflower oils. Avocado. Seasoning and other foods Herbs. Spices. Seasoning mixes without salt. Unsalted popcorn and pretzels. Fat-free sweets. What foods are not recommended? The items listed may not be a complete list. Talk with your dietitian about what dietary choices are best for you. Grains Baked goods made with fat, such as croissants, muffins, or some breads. Dry pasta or rice meal packs. Vegetables Creamed or fried vegetables. Vegetables in a cheese sauce. Regular canned vegetables (not low-sodium or reduced-sodium). Regular canned tomato sauce and paste (not low-sodium or reduced-sodium). Regular tomato and vegetable juice (not low-sodium or reduced-sodium). Pickles. Olives. Fruits Canned fruit in a light or heavy syrup. Fried fruit. Fruit in cream or butter sauce. Meat and other protein foods Fatty cuts of meat. Ribs. Fried meat. Bacon. Sausage. Bologna and other processed lunch meats. Salami. Fatback. Hotdogs. Bratwurst. Salted nuts and seeds. Canned beans with added salt. Canned or smoked fish. Whole eggs or egg yolks. Chicken or turkey with skin. Dairy Whole or 2% milk, cream, and half-and-half. Whole or full-fat cream cheese. Whole-fat or sweetened yogurt. Full-fat cheese. Nondairy creamers. Whipped toppings.  Processed cheese and cheese spreads. Fats and oils Butter. Stick margarine. Lard. Shortening. Ghee. Bacon fat. Tropical oils, such as coconut, palm kernel, or palm oil. Seasoning and other foods Salted popcorn and pretzels. Onion salt, garlic salt, seasoned salt, table salt, and sea salt. Worcestershire sauce. Tartar sauce. Barbecue sauce. Teriyaki sauce. Soy sauce, including reduced-sodium. Steak sauce. Canned and packaged gravies. Fish sauce. Oyster sauce. Cocktail sauce. Horseradish that you find on the shelf. Ketchup. Mustard. Meat flavorings and tenderizers. Bouillon cubes. Hot sauce and Tabasco sauce. Premade or packaged marinades. Premade or packaged taco seasonings. Relishes. Regular salad dressings. Where to find more information:  National Heart, Lung, and Blood Institute: www.nhlbi.nih.gov  American Heart Association: www.heart.org Summary  The DASH eating plan is a healthy eating plan that has been shown to reduce high blood pressure (hypertension). It may also reduce your risk for type 2 diabetes, heart disease, and stroke.  With the DASH eating plan, you should limit salt (sodium) intake to 2,300 mg a day. If you have hypertension, you may need to reduce your sodium intake to 1,500 mg a day.  When on the DASH eating plan, aim to eat more fresh fruits and vegetables, whole grains, lean proteins, low-fat dairy, and heart-healthy fats.  Work with your health care provider or diet and nutrition specialist (dietitian) to adjust your eating plan to your individual   calorie needs. This information is not intended to replace advice given to you by your health care provider. Make sure you discuss any questions you have with your health care provider. Document Released: 07/20/2011 Document Revised: 07/24/2016 Document Reviewed: 07/24/2016 Elsevier Interactive Patient Education  2017 [employer] Inc.  

## 2017-02-21 NOTE — Progress Notes (Signed)
   Subjective:    Patient ID: Jeremy Goodman, male    DOB: November 05, 1980, 36 y.o.   MRN: [id number]  Hypertension  This is a chronic problem. The current episode started more than 1 year ago. Pertinent negatives include no chest pain, headaches or shortness of breath. Risk factors for coronary artery disease include male gender and obesity. Treatments tried: lisinopril and toprol. There are no compliance problems.   Patient with urinary frequency some foul odor once his urine check Some swelling in the legs history of low ejection fraction history congestive heart failure on anticoagulant because of history of intermittent atrial fibrillation not having any bleeding issues does have a history hyperlipidemia does have a history of congenital heart disease and morbid obesity Patient had history of atrial flutter and atrial fibrillation on anticoagulant Patient would like handicap form filled out and to be checked for a UTI  Review of Systems  Constitutional: Negative for activity change, fatigue and fever.  Respiratory: Negative for cough and shortness of breath.   Cardiovascular: Negative for chest pain and leg swelling.  Neurological: Negative for headaches.       Objective:   Physical Exam  Constitutional: He appears well-nourished. No distress.  Cardiovascular: Normal rate, regular rhythm and normal heart sounds.   No murmur heard. Pulmonary/Chest: Effort normal and breath sounds normal. No respiratory distress.  Musculoskeletal: He exhibits no edema.  Lymphadenopathy:    He has no cervical adenopathy.  Neurological: He is alert.  Psychiatric: His behavior is normal.  Vitals reviewed.         Assessment & Plan:  Dysuria urinary frequency urine culture ordered urinalysis looks okay  Patient on anticoagulants because of his underlying heart disease as well as having a history of intermittent atrial fibrillation. He is at risk of stroke therefore they have him on [place]  Morbid  obesity patient is trying to lose weight watch diet he is unable to exercise much because of his condition  Mild CHF stable currently no sign of any swelling in the legs continue current medications follow-up with cardiac specialist later this year follow-up with [place] within 6 months  Hyperlipidemia check lipid profile  CBC ordered as well

## 2017-02-22 LAB — LIPID PANEL
Chol/HDL Ratio: 4.2 ratio (ref 0.0–5.0)
Cholesterol, Total: 137 mg/dL (ref 100–199)
HDL: 33 mg/dL — ABNORMAL LOW (ref >39)
LDL Calculated: 93 mg/dL (ref 0–99)
TRIGLYCERIDES: 53 mg/dL (ref 0–149)
VLDL Cholesterol Cal: 11 mg/dL (ref 5–40)

## 2017-02-22 LAB — CBC WITH DIFFERENTIAL/PLATELET
BASOS: 0 %
Basophils Absolute: 0 10*3/uL (ref 0.0–0.2)
EOS (ABSOLUTE): 0.1 10*3/uL (ref 0.0–0.4)
EOS: 2 %
HEMATOCRIT: 41.9 % (ref 37.5–51.0)
HEMOGLOBIN: 14 g/dL (ref 13.0–17.7)
Immature Grans (Abs): 0 10*3/uL (ref 0.0–0.1)
Immature Granulocytes: 0 %
LYMPHS ABS: 2.2 10*3/uL (ref 0.7–3.1)
Lymphs: 30 %
MCH: 30.4 pg (ref 26.6–33.0)
MCHC: 33.4 g/dL (ref 31.5–35.7)
MCV: 91 fL (ref 79–97)
MONOCYTES: 7 %
Monocytes Absolute: 0.5 10*3/uL (ref 0.1–0.9)
NEUTROS ABS: 4.6 10*3/uL (ref 1.4–7.0)
Neutrophils: 61 %
Platelets: 235 10*3/uL (ref 150–379)
RBC: 4.6 x10E6/uL (ref 4.14–5.80)
RDW: 14.3 % (ref 12.3–15.4)
WBC: 7.5 10*3/uL (ref 3.4–10.8)

## 2017-02-23 ENCOUNTER — Telehealth: Payer: Self-pay | Admitting: Family Medicine

## 2017-02-23 LAB — PLEASE NOTE

## 2017-02-23 LAB — URINE CULTURE

## 2017-02-23 MED ORDER — CEFPROZIL 500 MG PO TABS: 500.0000 mg | ORAL_TABLET | Freq: Two times a day (BID) | ORAL | 0 refills | Status: DC

## 2017-02-23 NOTE — Telephone Encounter (Signed)
Spoke with patient and informed him per Dr.Scott Luking- Dr.Scott received  Preliminary urine culture shows mild bacterial growth. I recommend Cefzil 500 mg 1 twice a day for 7 days. Follow-up if any problems. Patient verbalized understanding.

## 2017-02-23 NOTE — Telephone Encounter (Signed)
Please let the patient know that pulmonary urine culture shows mild bacterial growth. I recommend Cefzil 500 mg 1 twice a day for 7 days. Follow-up if any problems

## 2017-02-23 NOTE — Addendum Note (Signed)
Addended by: Ofilia Neas R on: 02/23/2017 10:07 AM   Modules accepted: Orders

## 2017-04-05 ENCOUNTER — Encounter: Payer: Self-pay | Admitting: Family Medicine

## 2017-04-05 ENCOUNTER — Ambulatory Visit (INDEPENDENT_AMBULATORY_CARE_PROVIDER_SITE_OTHER): Payer: Medicare Other | Admitting: Family Medicine

## 2017-04-05 VITALS — BP 118/80 | Temp 98.2°F | Ht 71.0 in | Wt 319.0 lb

## 2017-04-05 DIAGNOSIS — K4090 Unilateral inguinal hernia, without obstruction or gangrene, not specified as recurrent: Secondary | ICD-10-CM

## 2017-04-05 DIAGNOSIS — R300 Dysuria: Secondary | ICD-10-CM | POA: Diagnosis not present

## 2017-04-05 DIAGNOSIS — R319 Hematuria, unspecified: Secondary | ICD-10-CM

## 2017-04-05 DIAGNOSIS — N390 Urinary tract infection, site not specified: Secondary | ICD-10-CM

## 2017-04-05 LAB — POCT URINALYSIS DIPSTICK
NITRITE UA: NEGATIVE
PH UA: 5 (ref 5.0–8.0)
Spec Grav, UA: >=1.03 — AB (ref 1.010–1.025)

## 2017-04-05 MED ORDER — SULFAMETHOXAZOLE-TRIMETHOPRIM 800-160 MG PO TABS: 1.0000 | ORAL_TABLET | Freq: Two times a day (BID) | ORAL | 0 refills | Status: DC

## 2017-04-05 NOTE — Progress Notes (Signed)
   Subjective:    Patient ID: Jeremy Goodman, male    DOB: 03/15/1981, 36 y.o.   MRN: [id number]  Urinary Tract Infection   This is a new problem. The current episode started in the past 7 days.  Denies dysuria relates urine with odor frequency denies high fever chills sweats denies flank pain also relates groin fullness on the left side. States he has an odor to the urine.Urine is cloudy and see flakes of skin in it.  Review of Systems Results for orders placed or performed in visit on 04/05/17  POCT urinalysis dipstick  Result Value Ref Range   Color, UA     Clarity, UA     Glucose, UA     Bilirubin, UA     Ketones, UA     Spec Grav, UA >=1.030 (A) 1.010 - 1.025   Blood, UA trace    pH, UA 5.0 5.0 - 8.0   Protein, UA     Urobilinogen, UA  0.2 or 1.0 E.U./dL   Nitrite, UA Negative    Leukocytes, UA Large (3+) (A) Negative       Objective:   Physical Exam  HENT:  Head: Normocephalic.  Cardiovascular: Normal rate and regular rhythm.   Pulmonary/Chest: Effort normal and breath sounds normal. No respiratory distress. He has no wheezes. He has no rales. He exhibits no tenderness.  Abdominal: Soft. He exhibits no distension. There is no tenderness.  Musculoskeletal: He exhibits no edema.  Neurological: He is alert.  Skin: No erythema.   Possibility of a left inguinal hernia it is difficult to tell  Patient with small amount of blood in the sperm recently last week not a sign of any underlying issue we will watch closely prostate exam today good     Assessment & Plan:  Gen. surgery consultation left inguinal hernia  Urology consultation frequent UTIs  UTI detected antibiotics prescribed culture sent, Bactrim DS twice a day 7 days  Patient is trying to lose weight in relation to his morbid obesity CHF issues followed by specialist at [facility] in addition to this I believe the patient would be best served by seeing specialist at [facility] given his heart

## 2017-04-09 LAB — PLEASE NOTE

## 2017-04-09 LAB — URINE CULTURE

## 2017-04-10 ENCOUNTER — Encounter: Payer: Self-pay | Admitting: Family Medicine

## 2017-04-24 DIAGNOSIS — I517 Cardiomegaly: Secondary | ICD-10-CM | POA: Diagnosis not present

## 2017-04-24 DIAGNOSIS — Q203 Discordant ventriculoarterial connection: Secondary | ICD-10-CM | POA: Diagnosis not present

## 2017-04-24 DIAGNOSIS — I519 Heart disease, unspecified: Secondary | ICD-10-CM | POA: Diagnosis not present

## 2017-04-24 DIAGNOSIS — Q243 Pulmonary infundibular stenosis: Secondary | ICD-10-CM | POA: Diagnosis not present

## 2017-05-03 ENCOUNTER — Other Ambulatory Visit: Payer: Self-pay | Admitting: Family Medicine

## 2017-05-18 DIAGNOSIS — Z87440 Personal history of urinary (tract) infections: Secondary | ICD-10-CM | POA: Diagnosis not present

## 2017-05-18 DIAGNOSIS — Z841 Family history of disorders of kidney and ureter: Secondary | ICD-10-CM | POA: Diagnosis not present

## 2017-05-18 DIAGNOSIS — Q243 Pulmonary infundibular stenosis: Secondary | ICD-10-CM | POA: Diagnosis not present

## 2017-05-18 DIAGNOSIS — I519 Heart disease, unspecified: Secondary | ICD-10-CM | POA: Diagnosis not present

## 2017-05-18 DIAGNOSIS — I517 Cardiomegaly: Secondary | ICD-10-CM | POA: Diagnosis not present

## 2017-05-18 DIAGNOSIS — Q203 Discordant ventriculoarterial connection: Secondary | ICD-10-CM | POA: Diagnosis not present

## 2017-05-18 DIAGNOSIS — R3129 Other microscopic hematuria: Secondary | ICD-10-CM | POA: Diagnosis not present

## 2017-06-11 ENCOUNTER — Other Ambulatory Visit: Payer: Self-pay | Admitting: Family Medicine

## 2017-07-06 ENCOUNTER — Ambulatory Visit (INDEPENDENT_AMBULATORY_CARE_PROVIDER_SITE_OTHER): Payer: Medicare Other | Admitting: Family Medicine

## 2017-07-06 ENCOUNTER — Encounter: Payer: Self-pay | Admitting: Family Medicine

## 2017-07-06 VITALS — BP 120/74 | Temp 98.3°F | Wt 314.1 lb

## 2017-07-06 DIAGNOSIS — J019 Acute sinusitis, unspecified: Secondary | ICD-10-CM

## 2017-07-06 MED ORDER — CEFPROZIL 500 MG PO TABS: 500.0000 mg | ORAL_TABLET | Freq: Two times a day (BID) | ORAL | 0 refills | Status: DC

## 2017-07-06 NOTE — Progress Notes (Signed)
   Subjective:    Patient ID: Jeremy Goodman, male    DOB: 1981-04-18, 36 y.o.   MRN: [id number]  Sinusitis  This is a new problem. The current episode started yesterday. Associated symptoms include congestion, coughing, headaches and a sore throat. Pertinent negatives include no ear pain. (Diarrhea) Treatments tried: Mucinex, Natural oils.    Viral-like illness several days now with head congestion drainage coughing sinus pressure  Review of Systems  Constitutional: Negative for activity change and fever.  HENT: Positive for congestion, rhinorrhea and sore throat. Negative for ear pain.   Eyes: Negative for discharge.  Respiratory: Positive for cough. Negative for wheezing.   Cardiovascular: Negative for chest pain.  Neurological: Positive for headaches.       Objective:   Physical Exam  Constitutional: He appears well-developed.  HENT:  Head: Normocephalic.  Mouth/Throat: Oropharynx is clear and moist. No oropharyngeal exudate.  Neck: Normal range of motion.  Cardiovascular: Normal rate, regular rhythm and normal heart sounds.  No murmur heard. Pulmonary/Chest: Effort normal and breath sounds normal. He has no wheezes.  Lymphadenopathy:    He has no cervical adenopathy.  Neurological: He exhibits normal muscle tone.  Skin: Skin is warm and dry.  Nursing note and vitals reviewed.         Assessment & Plan:  Viral syndrome Possible early sinusitis Antibiotic prescription printed given to the patient he will see if this will run its own course over the next few days but if it worsens he will get the antibiotic filled

## 2017-08-02 ENCOUNTER — Other Ambulatory Visit: Payer: Self-pay | Admitting: Family Medicine

## 2017-08-02 NOTE — Telephone Encounter (Signed)
My bad, exploring further dr Nicki Reaper has already refilled the elizuis, one mo of that too and chronic f u visitg with in one mo

## 2017-08-02 NOTE — Telephone Encounter (Signed)
Actually saw jan and July for chronic visit, may ref lisinopril one mo. Needs o v with dr Nicki Reaper, call pt, if on eliquis for a fib I think at his young age card should mai9ntain

## 2017-08-03 ENCOUNTER — Telehealth: Payer: Self-pay | Admitting: Family Medicine

## 2017-08-03 NOTE — Telephone Encounter (Signed)
Thought we did tht 30 d worth both o v with dr Nicki Reaper

## 2017-08-03 NOTE — Telephone Encounter (Signed)
Prescriptions sent electronically to pharmacy. Patient notified and scheduled office visit

## 2017-08-03 NOTE — Telephone Encounter (Signed)
Requesting Rx for lisinopril and eliquis.   [facility]

## 2017-08-24 ENCOUNTER — Encounter: Payer: Self-pay | Admitting: Family Medicine

## 2017-08-24 ENCOUNTER — Ambulatory Visit (INDEPENDENT_AMBULATORY_CARE_PROVIDER_SITE_OTHER): Payer: Medicare Other | Admitting: Family Medicine

## 2017-08-24 VITALS — BP 118/82 | Ht 71.0 in | Wt 313.0 lb

## 2017-08-24 DIAGNOSIS — R7303 Prediabetes: Secondary | ICD-10-CM

## 2017-08-24 DIAGNOSIS — Z23 Encounter for immunization: Secondary | ICD-10-CM

## 2017-08-24 DIAGNOSIS — I10 Essential (primary) hypertension: Secondary | ICD-10-CM

## 2017-08-24 DIAGNOSIS — Z79899 Other long term (current) drug therapy: Secondary | ICD-10-CM | POA: Diagnosis not present

## 2017-08-24 DIAGNOSIS — Z7901 Long term (current) use of anticoagulants: Secondary | ICD-10-CM

## 2017-08-24 DIAGNOSIS — E7849 Other hyperlipidemia: Secondary | ICD-10-CM | POA: Diagnosis not present

## 2017-08-24 NOTE — Progress Notes (Signed)
   Subjective:    Patient ID: Jeremy Goodman, male    DOB: Aug 31, 1980, 37 y.o.   MRN: [id number]  HPI Patient is here today to follow up on Htn. He is currently on Lisinopril 2.5mg  once daily and metoprolol xl 50 mg one and half daily , spironolactone 25 one daily and lasix 20 mg Bid.He exercises,and tries to eat healthy.  Patient does take his blood pressure medicines he does not watch his diet he does try to cut back on portions he states he does not always eat healthy he does not exercise currently but he is planning on doing some stationary bike he denies any chest tightness pressure pain he does have congenital heart disease with history of CHF he does take his medicines he suffers with morbid obesity he knows he needs to lose weight for his heart he also suffers with prediabetes but does not always watch sugar Review of Systems  Constitutional: Negative for activity change, fatigue and fever.  Respiratory: Negative for cough and shortness of breath.   Cardiovascular: Negative for chest pain and leg swelling.  Neurological: Negative for headaches.       Objective:   Physical Exam  Constitutional: He appears well-nourished. No distress.  Cardiovascular: Normal rate, regular rhythm and normal heart sounds.  No murmur heard. Pulmonary/Chest: Effort normal and breath sounds normal. No respiratory distress.  Musculoskeletal: He exhibits no edema.  Lymphadenopathy:    He has no cervical adenopathy.  Neurological: He is alert.  Psychiatric: His behavior is normal.  Vitals reviewed.    25 minutes was spent with the patient. Greater than half the time was spent in discussion and answering questions and counseling regarding the issues that the patient came in for today.      Assessment & Plan:  1. Essential hypertension Blood pressure under good control continue medication watch diet - Basic metabolic panel  2. Other hyperlipidemia Watch fats in diet check lipid profile await the  results - Lipid panel  3. Prediabetes Avoid excessive sugars check A1c try to lose weight try to exercise on a regular basis within toleration - Hemoglobin A1c  4. Morbid obesity ([place]) Significant weight loss necessary encourage patient to continue to lose weight trying to get below 275 pounds  5. Need for influenza vaccination Flu shot today - Flu Vaccine QUAD 36+ mos IM  6. High risk medication use Lab work today - Hepatic function panel 25 minutes was spent with the patient. Greater than half the time was spent in discussion and answering questions and counseling regarding the issues that the patient came in for today.

## 2017-08-25 DIAGNOSIS — Z7901 Long term (current) use of anticoagulants: Secondary | ICD-10-CM | POA: Insufficient documentation

## 2017-08-25 LAB — HEPATIC FUNCTION PANEL
ALBUMIN: 4.7 g/dL (ref 3.5–5.5)
ALK PHOS: 104 IU/L (ref 39–117)
ALT: 24 IU/L (ref 0–44)
AST: 23 IU/L (ref 0–40)
BILIRUBIN TOTAL: 0.8 mg/dL (ref 0.0–1.2)
Bilirubin, Direct: 0.22 mg/dL (ref 0.00–0.40)
TOTAL PROTEIN: 8.9 g/dL — AB (ref 6.0–8.5)

## 2017-08-25 LAB — BASIC METABOLIC PANEL
BUN/Creatinine Ratio: 16 (ref 9–20)
BUN: 15 mg/dL (ref 6–20)
CALCIUM: 10 mg/dL (ref 8.7–10.2)
CO2: 21 mmol/L (ref 20–29)
CREATININE: 0.94 mg/dL (ref 0.76–1.27)
Chloride: 101 mmol/L (ref 96–106)
GFR calc Af Amer: 122 mL/min/{1.73_m2} (ref >59)
GFR, EST NON AFRICAN AMERICAN: 105 mL/min/{1.73_m2} (ref >59)
GLUCOSE: 81 mg/dL (ref 65–99)
Potassium: 4.7 mmol/L (ref 3.5–5.2)
Sodium: 139 mmol/L (ref 134–144)

## 2017-08-25 LAB — HEMOGLOBIN A1C
Est. average glucose Bld gHb Est-mCnc: 114 mg/dL
Hgb A1c MFr Bld: 5.6 % (ref 4.8–5.6)

## 2017-08-25 LAB — LIPID PANEL
CHOLESTEROL TOTAL: 177 mg/dL (ref 100–199)
Chol/HDL Ratio: 4.3 ratio (ref 0.0–5.0)
HDL: 41 mg/dL (ref >39)
LDL Calculated: 122 mg/dL — ABNORMAL HIGH (ref 0–99)
Triglycerides: 68 mg/dL (ref 0–149)
VLDL Cholesterol Cal: 14 mg/dL (ref 5–40)

## 2017-08-25 MED ORDER — METOPROLOL SUCCINATE ER 50 MG PO TB24: 75.0000 mg | ORAL_TABLET | Freq: Every day | ORAL | 6 refills | Status: DC

## 2017-08-25 MED ORDER — LISINOPRIL 2.5 MG PO TABS: 2.5000 mg | ORAL_TABLET | Freq: Every day | ORAL | 6 refills | Status: DC

## 2017-08-25 MED ORDER — APIXABAN 5 MG PO TABS: ORAL_TABLET | ORAL | 6 refills | Status: DC

## 2017-08-30 DIAGNOSIS — Z9889 Other specified postprocedural states: Secondary | ICD-10-CM | POA: Diagnosis not present

## 2017-08-30 DIAGNOSIS — I4892 Unspecified atrial flutter: Secondary | ICD-10-CM | POA: Diagnosis not present

## 2017-08-30 DIAGNOSIS — Q203 Discordant ventriculoarterial connection: Secondary | ICD-10-CM | POA: Diagnosis not present

## 2017-08-30 DIAGNOSIS — I519 Heart disease, unspecified: Secondary | ICD-10-CM | POA: Diagnosis not present

## 2017-09-19 ENCOUNTER — Ambulatory Visit: Payer: Medicare Other | Admitting: Family Medicine

## 2017-09-26 ENCOUNTER — Ambulatory Visit (INDEPENDENT_AMBULATORY_CARE_PROVIDER_SITE_OTHER): Payer: Medicare Other | Admitting: Family Medicine

## 2017-09-26 ENCOUNTER — Other Ambulatory Visit: Payer: Self-pay | Admitting: Family Medicine

## 2017-09-26 ENCOUNTER — Encounter: Payer: Self-pay | Admitting: Family Medicine

## 2017-09-26 VITALS — BP 128/72 | Temp 98.6°F | Ht 71.0 in | Wt 317.0 lb

## 2017-09-26 DIAGNOSIS — R300 Dysuria: Secondary | ICD-10-CM | POA: Diagnosis not present

## 2017-09-26 DIAGNOSIS — N3000 Acute cystitis without hematuria: Secondary | ICD-10-CM

## 2017-09-26 LAB — POCT URINALYSIS DIPSTICK
Urobilinogen, UA: 1 E.U./dL
pH, UA: 7 (ref 5.0–8.0)

## 2017-09-26 MED ORDER — CIPROFLOXACIN HCL 500 MG PO TABS: 500.0000 mg | ORAL_TABLET | Freq: Two times a day (BID) | ORAL | 0 refills | Status: DC

## 2017-09-26 NOTE — Progress Notes (Signed)
   Subjective:    Patient ID: Jeremy Goodman, male    DOB: 07/15/1981, 37 y.o.   MRN: [id number]  Urinary Tract Infection   This is a new problem. Episode onset: one week. Associated symptoms comments: dysuria. He has tried increased fluids for the symptoms.   Results for orders placed or performed in visit on 09/26/17  POCT urinalysis dipstick  Result Value Ref Range   Color, UA     Clarity, UA     Glucose, UA     Bilirubin, UA ++    Ketones, UA     Spec Grav, UA <=1.005 (A) 1.010 - 1.025   Blood, UA     pH, UA 7.0 5.0 - 8.0   Protein, UA     Urobilinogen, UA 1.0 0.2 or 1.0 E.U./dL   Nitrite, UA     Leukocytes, UA Trace (A) Negative   Appearance     Odor     Painful urination off and on   Stings with urination , no sig pain with it, no fever no vomiting  More freq urin due to lasix longterm  Patient goes into great length on all of his challenges last year regarding UTIs and subsequent consultation.  All notes read all specialist reports read in presence of patient   Review of Systems No headache, no major weight loss or weight gain, no chest pain no back pain abdominal pain no change in bowel habits complete ROS otherwise negative     Objective:   Physical Exam  Alert and oriented, vitals reviewed and stable, NAD ENT-TM's and ext canals WNL bilat via otoscopic exam Soft palate, tonsils and post pharynx WNL via oropharyngeal exam Neck-symmetric, no masses; thyroid nonpalpable and nontender Pulmonary-no tachypnea or accessory muscle use; Clear without wheezes via auscultation Card--no abnrml murmurs, rhythm reg and rate WNL Carotid pulses symmetric, without bruits   Occasional white blood cells per high-powered field       Assessment & Plan:  Impression potential UTI.  Prior notes from last year reviewed at length, frequent UTIs last year..  Urologist note reviewed.  They had actually recommended CT scan.  However patient expresses not much confidence in that  doctor.  Today having some symptoms that may or may not represent true infection.  Initiate Cipro, with some systemic symptoms will cover for potential upper renal involvement.  Follow-up in a couple weeks.  Culture urine.  Urinalysis then discussion with patient and primary care doctor Dr. Nicki Reaper whether to press on with further imaging  Greater than 50% of this 25 minute face to face visit was spent in counseling and discussion and coordination of care regarding the above diagnosis/diagnosies

## 2017-09-29 LAB — URINE CULTURE

## 2017-10-10 ENCOUNTER — Ambulatory Visit (INDEPENDENT_AMBULATORY_CARE_PROVIDER_SITE_OTHER): Payer: Medicare Other | Admitting: Family Medicine

## 2017-10-10 ENCOUNTER — Encounter: Payer: Self-pay | Admitting: Family Medicine

## 2017-10-10 VITALS — BP 122/76 | Temp 98.7°F | Ht 71.0 in | Wt 314.0 lb

## 2017-10-10 DIAGNOSIS — N390 Urinary tract infection, site not specified: Secondary | ICD-10-CM | POA: Diagnosis not present

## 2017-10-10 LAB — POCT URINALYSIS DIPSTICK
PH UA: 5 (ref 5.0–8.0)
Spec Grav, UA: >=1.03 — AB (ref 1.010–1.025)

## 2017-10-10 NOTE — Progress Notes (Signed)
   Subjective:    Patient ID: Jeremy Goodman, male    DOB: 11-03-1980, 37 y.o.   MRN: [id number]  Urinary Tract Infection   Chronicity: follow up. Pertinent negatives include no hematuria, nausea or vomiting.  pt not having any urinary symptoms. Wanted to follow up to see if infection was cleared up. Finished cipro.  The patient is concerned about having the urinary tract infection he wants to make sure that it is eliminated He also states urology recommended that he have a CT scan I reviewed the urology notes from [facility] it stated that he could have a CT scan to look for stones but patient states he is not having any symptoms of stones whatsoever so therefore I would hold off on doing this scan patient agrees with this patient denies any hematuria Results for orders placed or performed in visit on 10/10/17  POCT urinalysis dipstick  Result Value Ref Range   Color, UA     Clarity, UA     Glucose, UA     Bilirubin, UA ++    Ketones, UA     Spec Grav, UA >=1.030 (A) 1.010 - 1.025   Blood, UA     pH, UA 5.0 5.0 - 8.0   Protein, UA     Urobilinogen, UA  0.2 or 1.0 E.U./dL   Nitrite, UA     Leukocytes, UA  Negative   Appearance     Odor      Review of Systems  Constitutional: Negative for activity change.  HENT: Negative for congestion and rhinorrhea.   Respiratory: Negative for cough and shortness of breath.   Cardiovascular: Negative for chest pain.  Gastrointestinal: Negative for abdominal pain, diarrhea, nausea and vomiting.  Genitourinary: Negative for dysuria and hematuria.  Neurological: Negative for weakness and headaches.  Psychiatric/Behavioral: Negative for confusion.       Objective:   Physical Exam  Constitutional: He appears well-nourished. No distress.  HENT:  Head: Normocephalic and atraumatic.  Eyes: Right eye exhibits no discharge. Left eye exhibits no discharge.  Neck: No tracheal deviation present.  Cardiovascular: Normal rate, regular rhythm and  normal heart sounds.  No murmur heard. Pulmonary/Chest: Effort normal and breath sounds normal. No respiratory distress. He has no wheezes.  Musculoskeletal: He exhibits no edema.  Lymphadenopathy:    He has no cervical adenopathy.  Neurological: He is alert.  Skin: Skin is warm. No rash noted.  Psychiatric: His behavior is normal.  Vitals reviewed.    The patient's BMI is calculated.  It is in the vital signs and acknowledged.  It is above the recommended BMI for the patient's height and weight.  The patient has been counseled regarding healthy diet, restricted portions, avoiding excessive carbohydrates/sugary foods, and increase physical activity as health permits.  It is in the patient's best interest to lower the risk of secondary illness including heart disease strokes and cancer by losing weight.  The patient acknowledges this information.      Assessment & Plan:  UTI Urinalysis looks good This is cleared up He will report to [place] if any ongoing troubles No further testing necessary currently Blood pressure good continue heart medicine Patient encouraged to lose weight

## 2017-10-25 DIAGNOSIS — Z9889 Other specified postprocedural states: Secondary | ICD-10-CM | POA: Diagnosis not present

## 2017-10-25 DIAGNOSIS — Z8679 Personal history of other diseases of the circulatory system: Secondary | ICD-10-CM | POA: Diagnosis not present

## 2017-10-25 DIAGNOSIS — Q243 Pulmonary infundibular stenosis: Secondary | ICD-10-CM | POA: Diagnosis not present

## 2017-10-25 DIAGNOSIS — Q203 Discordant ventriculoarterial connection: Secondary | ICD-10-CM | POA: Diagnosis not present

## 2017-11-19 ENCOUNTER — Encounter: Payer: Self-pay | Admitting: Family Medicine

## 2017-11-19 ENCOUNTER — Ambulatory Visit (INDEPENDENT_AMBULATORY_CARE_PROVIDER_SITE_OTHER): Payer: Medicare Other | Admitting: Family Medicine

## 2017-11-19 VITALS — BP 120/82 | Temp 98.5°F | Ht 71.0 in | Wt 311.4 lb

## 2017-11-19 DIAGNOSIS — R300 Dysuria: Secondary | ICD-10-CM | POA: Diagnosis not present

## 2017-11-19 DIAGNOSIS — N390 Urinary tract infection, site not specified: Secondary | ICD-10-CM

## 2017-11-19 LAB — POCT URINALYSIS DIPSTICK
SPEC GRAV UA: 1.02 (ref 1.010–1.025)
pH, UA: 7 (ref 5.0–8.0)

## 2017-11-19 MED ORDER — SULFAMETHOXAZOLE-TRIMETHOPRIM 800-160 MG PO TABS: 1.0000 | ORAL_TABLET | Freq: Two times a day (BID) | ORAL | 0 refills | Status: DC

## 2017-11-19 NOTE — Progress Notes (Signed)
   Subjective:    Patient ID: Jeremy Goodman, male    DOB: [birth year], 37 y.o.   MRN: [id number]  HPI  Patient arrives with dysuria since Saturday.  Patient with intermittent dysuria denies high fever chills sweats denies abdominal pain flank pain nausea vomiting diarrhea PMH benign  Review of Systems  Constitutional: Negative for activity change, fatigue and fever.  HENT: Negative for congestion and rhinorrhea.   Respiratory: Negative for cough and shortness of breath.   Cardiovascular: Negative for chest pain and leg swelling.  Gastrointestinal: Negative for abdominal pain, diarrhea and nausea.  Genitourinary: Negative for dysuria and hematuria.  Neurological: Negative for weakness and headaches.  Psychiatric/Behavioral: Negative for behavioral problems.       Objective:   Physical Exam  Constitutional: He appears well-nourished.  Cardiovascular: Normal rate, regular rhythm and normal heart sounds.  No murmur heard. Pulmonary/Chest: Effort normal and breath sounds normal.  Musculoskeletal: He exhibits no edema.  Lymphadenopathy:    He has no cervical adenopathy.  Neurological: He is alert.  Psychiatric: His behavior is normal.  Vitals reviewed.         Assessment & Plan:  Probable UTI Urine culture Antibiotics prescribed Warning signs discussed follow-up if problems

## 2017-11-19 NOTE — Addendum Note (Signed)
Addended by: Karle Barr on: 11/19/2017 03:06 PM   Modules accepted: Orders

## 2017-11-21 LAB — URINE CULTURE

## 2017-11-21 LAB — SPECIMEN STATUS REPORT

## 2017-11-28 ENCOUNTER — Other Ambulatory Visit: Payer: Self-pay | Admitting: *Deleted

## 2017-11-28 ENCOUNTER — Telehealth: Payer: Self-pay | Admitting: Family Medicine

## 2017-11-28 MED ORDER — CIPROFLOXACIN HCL 500 MG PO TABS: 500.0000 mg | ORAL_TABLET | Freq: Two times a day (BID) | ORAL | 0 refills | Status: DC

## 2017-11-28 NOTE — Telephone Encounter (Signed)
Patient seen Dr. Nicki Reaper on 11/19/17 for a UTI.  He said the bactrim did not help.  He is still having painful urination.  Would like another antibiotic called in.   [facility]

## 2017-11-28 NOTE — Telephone Encounter (Signed)
Discussed with pt. Med sent to pharm.  

## 2017-11-28 NOTE — Telephone Encounter (Signed)
Med sent to pharm. Tried to call pt. No answer

## 2017-11-28 NOTE — Telephone Encounter (Signed)
Clarification. Med for 21 days. Pt states he had UTI.

## 2017-11-28 NOTE — Telephone Encounter (Signed)
With persistent or recurrent often makes you wonder in adults if there is a prostate component, thus the 21, go ahead and knock down to 10 days orth, if persists, o v wioth dr Nicki Reaper

## 2017-11-28 NOTE — Telephone Encounter (Signed)
Pt states burning not as bad but is still there. No abd pain, no back pain, no fever. Wants to know if he can get cipro.

## 2017-11-28 NOTE — Telephone Encounter (Signed)
cipr 500 one bid 21 dats

## 2018-01-01 ENCOUNTER — Other Ambulatory Visit: Payer: Self-pay | Admitting: Family Medicine

## 2018-01-10 DIAGNOSIS — I4892 Unspecified atrial flutter: Secondary | ICD-10-CM | POA: Diagnosis not present

## 2018-01-10 DIAGNOSIS — Q203 Discordant ventriculoarterial connection: Secondary | ICD-10-CM | POA: Diagnosis not present

## 2018-01-10 DIAGNOSIS — Z8679 Personal history of other diseases of the circulatory system: Secondary | ICD-10-CM | POA: Diagnosis not present

## 2018-01-10 DIAGNOSIS — Z95810 Presence of automatic (implantable) cardiac defibrillator: Secondary | ICD-10-CM | POA: Diagnosis not present

## 2018-01-10 DIAGNOSIS — Z9889 Other specified postprocedural states: Secondary | ICD-10-CM | POA: Diagnosis not present

## 2018-01-29 DIAGNOSIS — H52223 Regular astigmatism, bilateral: Secondary | ICD-10-CM | POA: Diagnosis not present

## 2018-01-29 DIAGNOSIS — H5213 Myopia, bilateral: Secondary | ICD-10-CM | POA: Diagnosis not present

## 2018-02-13 ENCOUNTER — Ambulatory Visit (INDEPENDENT_AMBULATORY_CARE_PROVIDER_SITE_OTHER): Payer: Medicare Other | Admitting: Family Medicine

## 2018-02-13 ENCOUNTER — Encounter: Payer: Self-pay | Admitting: Family Medicine

## 2018-02-13 VITALS — BP 128/82 | Ht 71.0 in | Wt 304.2 lb

## 2018-02-13 DIAGNOSIS — E7849 Other hyperlipidemia: Secondary | ICD-10-CM | POA: Diagnosis not present

## 2018-02-13 DIAGNOSIS — I10 Essential (primary) hypertension: Secondary | ICD-10-CM | POA: Diagnosis not present

## 2018-02-13 DIAGNOSIS — R233 Spontaneous ecchymoses: Secondary | ICD-10-CM

## 2018-02-13 DIAGNOSIS — W57XXXA Bitten or stung by nonvenomous insect and other nonvenomous arthropods, initial encounter: Secondary | ICD-10-CM

## 2018-02-13 MED ORDER — TRIAMCINOLONE ACETONIDE 0.1 % EX CREA: 1.0000 "application " | TOPICAL_CREAM | Freq: Two times a day (BID) | CUTANEOUS | 0 refills | Status: DC

## 2018-02-13 NOTE — Progress Notes (Signed)
   Subjective:    Patient ID: Jeremy Goodman, male    DOB: Sep 25, 1980, 37 y.o.   MRN: [id number]  HPI  Patient arrives with rash/bug bites on arms and legs for a week. This been present over the past week itches a lot but also bruised up some he is worried it could be a drug reaction or bug bites is mainly on the arms and legs one on the waistline none other anywhere else  Taken his heart and blood pressure medicines on a regular basis not having any problem with these keeping blood pressure under good control try to eat healthy  Obesity has had this for years is been trying to watch his diet lose weight and bring it under better control  Patient is due for lipid profile tries to watch diet has had problems with lipids in the past   Review of Systems  Constitutional: Negative for activity change, appetite change and fatigue.  HENT: Negative for congestion and rhinorrhea.   Respiratory: Negative for cough, chest tightness and shortness of breath.   Cardiovascular: Negative for chest pain and leg swelling.  Gastrointestinal: Negative for abdominal pain, diarrhea and nausea.  Endocrine: Negative for polydipsia and polyphagia.  Genitourinary: Negative for dysuria and hematuria.  Neurological: Negative for weakness and headaches.  Psychiatric/Behavioral: Negative for confusion and dysphoric mood.       Objective:   Physical Exam  Constitutional: He appears well-nourished. No distress.  Cardiovascular: Normal rate, regular rhythm and normal heart sounds.  No murmur heard. Pulmonary/Chest: Effort normal and breath sounds normal. No respiratory distress.  Musculoskeletal: He exhibits no edema.  Lymphadenopathy:    He has no cervical adenopathy.  Neurological: He is alert.  Psychiatric: His behavior is normal.  Vitals reviewed.         Assessment & Plan:  HTN- Patient was seen today as part of a visit regarding hypertension. The importance of healthy diet and regular physical  activity was discussed. The importance of compliance with medications discussed.  Ideal goal is to keep blood pressure low elevated levels certainly below 474/25 when possible.  The patient was counseled that keeping blood pressure under control lessen his risk of complications.  The importance of regular follow-ups was discussed with the patient.  Low-salt diet such as DASH recommended.  Regular physical activity was recommended as well.  Patient was advised to keep regular follow-ups.  The patient was seen today as part of an evaluation regarding hyperlipidemia.  Recent lab work has been reviewed with the patient as well as the goals for good cholesterol care.  In addition to this medications have been discussed the importance of compliance with diet and medications discussed as well.  Finally the patient is aware that poor control of cholesterol, noncompliance can dramatically increase the risk of complications. The patient will keep regular office visits and the patient does agreed to periodic lab work.  Blood bites with associated petechiae check lab work await the results  Check chronic lab work  Follow-up in 3 to 4 months

## 2018-02-14 LAB — CBC WITH DIFFERENTIAL/PLATELET
BASOS ABS: 0 10*3/uL (ref 0.0–0.2)
BASOS: 0 %
EOS (ABSOLUTE): 0.2 10*3/uL (ref 0.0–0.4)
Eos: 2 %
HEMOGLOBIN: 15.2 g/dL (ref 13.0–17.7)
Hematocrit: 46.4 % (ref 37.5–51.0)
IMMATURE GRANS (ABS): 0 10*3/uL (ref 0.0–0.1)
Immature Granulocytes: 1 %
LYMPHS ABS: 2.1 10*3/uL (ref 0.7–3.1)
LYMPHS: 24 %
MCH: 30.2 pg (ref 26.6–33.0)
MCHC: 32.8 g/dL (ref 31.5–35.7)
MCV: 92 fL (ref 79–97)
MONOCYTES: 7 %
Monocytes Absolute: 0.7 10*3/uL (ref 0.1–0.9)
NEUTROS ABS: 5.9 10*3/uL (ref 1.4–7.0)
Neutrophils: 66 %
PLATELETS: 246 10*3/uL (ref 150–450)
RBC: 5.04 x10E6/uL (ref 4.14–5.80)
RDW: 12 % — ABNORMAL LOW (ref 12.3–15.4)
WBC: 8.9 10*3/uL (ref 3.4–10.8)

## 2018-02-14 LAB — BASIC METABOLIC PANEL
BUN / CREAT RATIO: 16 (ref 9–20)
BUN: 15 mg/dL (ref 6–20)
CALCIUM: 10.2 mg/dL (ref 8.7–10.2)
CHLORIDE: 102 mmol/L (ref 96–106)
CO2: 23 mmol/L (ref 20–29)
Creatinine, Ser: 0.95 mg/dL (ref 0.76–1.27)
GFR, EST AFRICAN AMERICAN: 119 mL/min/{1.73_m2} (ref >59)
GFR, EST NON AFRICAN AMERICAN: 103 mL/min/{1.73_m2} (ref >59)
Glucose: 84 mg/dL (ref 65–99)
POTASSIUM: 4.6 mmol/L (ref 3.5–5.2)
Sodium: 141 mmol/L (ref 134–144)

## 2018-02-14 LAB — LIPID PANEL
CHOLESTEROL TOTAL: 154 mg/dL (ref 100–199)
Chol/HDL Ratio: 3.9 ratio (ref 0.0–5.0)
HDL: 40 mg/dL (ref >39)
LDL Calculated: 102 mg/dL — ABNORMAL HIGH (ref 0–99)
Triglycerides: 62 mg/dL (ref 0–149)
VLDL CHOLESTEROL CAL: 12 mg/dL (ref 5–40)

## 2018-02-21 ENCOUNTER — Ambulatory Visit: Payer: Medicare Other | Admitting: Family Medicine

## 2018-02-25 ENCOUNTER — Ambulatory Visit: Payer: Medicare Other | Admitting: Family Medicine

## 2018-02-28 ENCOUNTER — Telehealth: Payer: Self-pay | Admitting: Family Medicine

## 2018-02-28 MED ORDER — DEXLANSOPRAZOLE 60 MG PO CPDR: 1.0000 | DELAYED_RELEASE_CAPSULE | Freq: Every day | ORAL | 0 refills | Status: DC

## 2018-02-28 NOTE — Telephone Encounter (Signed)
I sent in a 30 day supply to the request pharmacy. I called to inform the pt,but he had a vm that had not been set up yet.

## 2018-02-28 NOTE — Telephone Encounter (Signed)
Patient is requesting a 30 day supply of his Dexilant called in.  He said that he doesn't think that the pharmacy actually filled a 90 day supply in May when he last had filled, although they said they did.  He isn't sure if insurance will cover early, but he would like to have 30 day supply called in anyway's.  [facility]

## 2018-02-28 NOTE — Telephone Encounter (Signed)
Patient aware medication sent in to [facility].

## 2018-03-06 ENCOUNTER — Ambulatory Visit (INDEPENDENT_AMBULATORY_CARE_PROVIDER_SITE_OTHER): Payer: Medicare Other | Admitting: Family Medicine

## 2018-03-06 ENCOUNTER — Encounter: Payer: Self-pay | Admitting: Family Medicine

## 2018-03-06 VITALS — BP 154/86 | Temp 98.5°F | Ht 71.0 in | Wt 310.4 lb

## 2018-03-06 DIAGNOSIS — R300 Dysuria: Secondary | ICD-10-CM

## 2018-03-06 LAB — POCT URINALYSIS DIPSTICK
SPEC GRAV UA: 1.025 (ref 1.010–1.025)
pH, UA: 5 (ref 5.0–8.0)

## 2018-03-06 NOTE — Progress Notes (Signed)
   Subjective:    Patient ID: Jeremy Goodman, male    DOB: 06-24-1981, 37 y.o.   MRN: [id number]  Urinary Tract Infection   This is a new problem. The current episode started 1 to 4 weeks ago. Associated symptoms comments: Headache, painful urination . He has tried increased fluids for the symptoms.   Results for orders placed or performed in visit on 03/06/18  POCT Urinalysis Dipstick  Result Value Ref Range   Color, UA     Clarity, UA     Glucose, UA  Negative   Bilirubin, UA     Ketones, UA     Spec Grav, UA 1.025 1.010 - 1.025   Blood, UA     pH, UA 5.0 5.0 - 8.0   Protein, UA  Negative   Urobilinogen, UA  0.2 or 1.0 E.U./dL   Nitrite, UA     Leukocytes, UA  Negative   Appearance     Odor     Pos dtsuria  No sig fever   soe headaches  Felt slight chills  Appetite good ' Review of Systems No headache, no major weight loss or weight gain, no chest pain no back pain abdominal pain no change in bowel habits complete ROS otherwise negative     Objective:   Physical Exam Alert vitals stable, NAD. Blood pressure good on repeat. HEENT normal. Lungs clear. Heart regular rate and rhythm.   Urinalysis within normal limits no white blood cells number impression mild dysuria     Assessment & Plan:  Impression mild dysuria with patient concerned with history of urinary tract infections.  Urinalysis is perfect.  Would recommend culture urine and await results before treatment rationale discussed

## 2018-03-08 LAB — URINE CULTURE

## 2018-03-08 LAB — SPECIMEN STATUS REPORT

## 2018-03-21 DIAGNOSIS — Z95810 Presence of automatic (implantable) cardiac defibrillator: Secondary | ICD-10-CM | POA: Diagnosis not present

## 2018-03-21 DIAGNOSIS — Z8679 Personal history of other diseases of the circulatory system: Secondary | ICD-10-CM | POA: Diagnosis not present

## 2018-03-21 DIAGNOSIS — Z9889 Other specified postprocedural states: Secondary | ICD-10-CM | POA: Diagnosis not present

## 2018-03-21 DIAGNOSIS — Q203 Discordant ventriculoarterial connection: Secondary | ICD-10-CM | POA: Diagnosis not present

## 2018-03-21 DIAGNOSIS — I517 Cardiomegaly: Secondary | ICD-10-CM | POA: Diagnosis not present

## 2018-03-21 DIAGNOSIS — Q243 Pulmonary infundibular stenosis: Secondary | ICD-10-CM | POA: Diagnosis not present

## 2018-03-21 DIAGNOSIS — I519 Heart disease, unspecified: Secondary | ICD-10-CM | POA: Diagnosis not present

## 2018-03-21 DIAGNOSIS — I4892 Unspecified atrial flutter: Secondary | ICD-10-CM | POA: Diagnosis not present

## 2018-03-28 ENCOUNTER — Other Ambulatory Visit: Payer: Self-pay

## 2018-03-28 ENCOUNTER — Other Ambulatory Visit: Payer: Self-pay | Admitting: Family Medicine

## 2018-03-28 MED ORDER — DEXLANSOPRAZOLE 60 MG PO CPDR: 1.0000 | DELAYED_RELEASE_CAPSULE | Freq: Every day | ORAL | 5 refills | Status: DC

## 2018-04-25 DIAGNOSIS — I519 Heart disease, unspecified: Secondary | ICD-10-CM | POA: Diagnosis not present

## 2018-04-25 DIAGNOSIS — I517 Cardiomegaly: Secondary | ICD-10-CM | POA: Diagnosis not present

## 2018-04-25 DIAGNOSIS — Z9889 Other specified postprocedural states: Secondary | ICD-10-CM | POA: Diagnosis not present

## 2018-04-25 DIAGNOSIS — Q243 Pulmonary infundibular stenosis: Secondary | ICD-10-CM | POA: Diagnosis not present

## 2018-04-25 DIAGNOSIS — I4892 Unspecified atrial flutter: Secondary | ICD-10-CM | POA: Diagnosis not present

## 2018-04-25 DIAGNOSIS — R931 Abnormal findings on diagnostic imaging of heart and coronary circulation: Secondary | ICD-10-CM | POA: Diagnosis not present

## 2018-04-25 DIAGNOSIS — Z95810 Presence of automatic (implantable) cardiac defibrillator: Secondary | ICD-10-CM | POA: Diagnosis not present

## 2018-04-25 DIAGNOSIS — Q203 Discordant ventriculoarterial connection: Secondary | ICD-10-CM | POA: Diagnosis not present

## 2018-05-06 ENCOUNTER — Ambulatory Visit (INDEPENDENT_AMBULATORY_CARE_PROVIDER_SITE_OTHER): Payer: Medicare Other | Admitting: Family Medicine

## 2018-05-06 ENCOUNTER — Encounter: Payer: Self-pay | Admitting: Family Medicine

## 2018-05-06 VITALS — BP 126/82 | Ht 71.0 in | Wt 304.0 lb

## 2018-05-06 DIAGNOSIS — I10 Essential (primary) hypertension: Secondary | ICD-10-CM

## 2018-05-06 DIAGNOSIS — I5042 Chronic combined systolic (congestive) and diastolic (congestive) heart failure: Secondary | ICD-10-CM | POA: Diagnosis not present

## 2018-05-06 MED ORDER — METOPROLOL SUCCINATE ER 100 MG PO TB24: 100.0000 mg | ORAL_TABLET | Freq: Every day | ORAL | 6 refills | Status: DC

## 2018-05-06 MED ORDER — LISINOPRIL 2.5 MG PO TABS: 2.5000 mg | ORAL_TABLET | Freq: Every day | ORAL | 6 refills | Status: DC

## 2018-05-06 NOTE — Progress Notes (Signed)
   Subjective:    Patient ID: Jeremy Goodman, male    DOB: 01/25/1981, 37 y.o.   MRN: [id number]  Hypertension  This is a chronic problem. The current episode started more than 1 year ago. Pertinent negatives include no chest pain, headaches or shortness of breath. Risk factors for coronary artery disease include male gender. Treatments tried: lisinopril, lasix, metoprolol. There are no compliance problems.    Patient does have underlying congenital heart disease He is followed by Jarrett Soho with cardiology as well as transplant cardiology Has a history of mild failure    Review of Systems  Constitutional: Negative for diaphoresis and fatigue.  HENT: Negative for congestion and rhinorrhea.   Respiratory: Negative for cough and shortness of breath.   Cardiovascular: Negative for chest pain and leg swelling.  Gastrointestinal: Negative for abdominal pain and diarrhea.  Skin: Negative for color change and rash.  Neurological: Negative for dizziness and headaches.  Psychiatric/Behavioral: Negative for behavioral problems and confusion.       Objective:   Physical Exam  Constitutional: He appears well-nourished. No distress.  HENT:  Head: Normocephalic and atraumatic.  Eyes: Right eye exhibits no discharge. Left eye exhibits no discharge.  Neck: No tracheal deviation present.  Cardiovascular: Normal rate, regular rhythm and normal heart sounds.  No murmur heard. Pulmonary/Chest: Effort normal and breath sounds normal. No respiratory distress.  Musculoskeletal: He exhibits no edema.  Lymphadenopathy:    He has no cervical adenopathy.  Neurological: He is alert. Coordination normal.  Skin: Skin is warm and dry.  Psychiatric: He has a normal mood and affect. His behavior is normal.  Vitals reviewed.         Assessment & Plan:  The importance of taking his medicine discussed Watching diet trying to lose weight Keep blood pressure under good control Warning signs regarding  CHF discussed Follow-up 6 months Follow-up sooner problems Recent lab work overall look good he also gets lab work through [facility].

## 2018-07-25 DIAGNOSIS — Z029 Encounter for administrative examinations, unspecified: Secondary | ICD-10-CM

## 2018-08-10 ENCOUNTER — Other Ambulatory Visit: Payer: Self-pay

## 2018-08-10 ENCOUNTER — Encounter (HOSPITAL_COMMUNITY): Payer: Self-pay

## 2018-08-10 ENCOUNTER — Ambulatory Visit (HOSPITAL_COMMUNITY)
Admission: EM | Admit: 2018-08-10 | Discharge: 2018-08-10 | Disposition: A | Discharge status: Home / Self Care | Payer: Medicare Other | Attending: Family Medicine | Admitting: Family Medicine

## 2018-08-10 DIAGNOSIS — R7303 Prediabetes: Secondary | ICD-10-CM | POA: Diagnosis not present

## 2018-08-10 DIAGNOSIS — Z881 Allergy status to other antibiotic agents status: Secondary | ICD-10-CM | POA: Insufficient documentation

## 2018-08-10 DIAGNOSIS — Z6841 Body Mass Index (BMI) 40.0 and over, adult: Secondary | ICD-10-CM | POA: Insufficient documentation

## 2018-08-10 DIAGNOSIS — I509 Heart failure, unspecified: Secondary | ICD-10-CM | POA: Insufficient documentation

## 2018-08-10 DIAGNOSIS — I471 Supraventricular tachycardia: Secondary | ICD-10-CM | POA: Insufficient documentation

## 2018-08-10 DIAGNOSIS — J22 Unspecified acute lower respiratory infection: Secondary | ICD-10-CM | POA: Diagnosis not present

## 2018-08-10 DIAGNOSIS — E785 Hyperlipidemia, unspecified: Secondary | ICD-10-CM | POA: Diagnosis not present

## 2018-08-10 DIAGNOSIS — Z7901 Long term (current) use of anticoagulants: Secondary | ICD-10-CM | POA: Diagnosis not present

## 2018-08-10 DIAGNOSIS — Z79899 Other long term (current) drug therapy: Secondary | ICD-10-CM | POA: Diagnosis not present

## 2018-08-10 DIAGNOSIS — I110 Hypertensive heart disease with heart failure: Secondary | ICD-10-CM | POA: Insufficient documentation

## 2018-08-10 DIAGNOSIS — Z951 Presence of aortocoronary bypass graft: Secondary | ICD-10-CM | POA: Insufficient documentation

## 2018-08-10 DIAGNOSIS — Z95810 Presence of automatic (implantable) cardiac defibrillator: Secondary | ICD-10-CM | POA: Diagnosis not present

## 2018-08-10 DIAGNOSIS — R05 Cough: Secondary | ICD-10-CM | POA: Diagnosis present

## 2018-08-10 LAB — POCT RAPID STREP A: STREPTOCOCCUS, GROUP A SCREEN (DIRECT): NEGATIVE

## 2018-08-10 MED ORDER — PSEUDOEPH-BROMPHEN-DM 30-2-10 MG/5ML PO SYRP: 5.0000 mL | ORAL_SOLUTION | Freq: Four times a day (QID) | ORAL | 0 refills | Status: DC | PRN

## 2018-08-10 MED ORDER — CEFPROZIL 500 MG PO TABS: 500.0000 mg | ORAL_TABLET | Freq: Two times a day (BID) | ORAL | 0 refills | Status: AC

## 2018-08-10 NOTE — ED Triage Notes (Signed)
Pt cc cough and congestion x 7 days. Pt is cough up yellowish mucus.

## 2018-08-10 NOTE — ED Provider Notes (Addendum)
[place]    CSN: [id number] Arrival date & time: 08/10/18  1001     History   Chief Complaint Chief Complaint  Patient presents with  . Cough    HPI Jeremy Goodman is a 37 y.o. male history of CHF, transposition of great vessels presenting today for evaluation of cough and congestion.  Patient states that his symptoms began with a sore throat.  They have transitioned into developing a productive cough and feeling congested.  Minimal rhinorrhea.  Sore throat has been persistent.  He has tried cough drops, humidifier, Mucinex DM and Benadryl without relief.  Denies any fevers.  Occasional burning with coughing and chest.  Denies shortness of breath.  Symptoms have been going on for approximately 6 days.  Is concerned about sinus infection.  HPI  Past Medical History:  Diagnosis Date  . Anginal pain ([place])   . C. difficile diarrhea    2 weeks ago prior to admission 1/20 tx'ed with Flagyl   . CHF (congestive heart failure) ([place])   . HTN (hypertension) 08/21/2014  . Transposition of great vessel     Patient Active Problem List   Diagnosis Date Noted  . Chronic anticoagulation 08/25/2017  . Class 1 congestive heart failure, chronic, combined ([place]) 02/21/2017  . Hyperlipidemia 02/22/2016  . Prediabetes 08/26/2015  . Morbid obesity ([place]) 08/25/2015  . Obesity 08/21/2014  . HTN (hypertension) 08/21/2014  . Automatic implantable cardioverter-defibrillator in situ 06/17/2014  . Diaphragmatic paresis 10/02/2013  . Postprocedural state 02/28/2013  . 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 Personal history of infectious and parasitic disease 09/30/2012  . Pulmonary edema 09/03/2012  . Acute respiratory failure ([place]) 09/03/2012  . Transposition of great vessel 09/03/2012  . Cardiogenic shock ([place]) 09/03/2012  . Acute pulmonary edema ([place]) 09/03/2012  . Encephalopathy acute 09/03/2012  . SVT (supraventricular tachycardia) ([place]) 09/03/2012  . Other  postprocedural status(V45.89) 05/15/2011  . 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 CORONARY ARTERY BYPASS GRAFT    . IMPLANTABLE CARDIOVERTER DEFIBRILLATOR IMPLANT         Home Medications    Prior to Admission medications   Medication Sig Start Date End Date Taking? Authorizing Provider  acetaminophen (TYLENOL) 325 MG tablet Place 2 tablets (650 mg total) into feeding tube every 4 (four) hours as needed. 09/03/12   Edmisten, Brooke O, PA-C  brompheniramine-pseudoephedrine-DM 30-2-10 MG/5ML syrup Take 5 mLs by mouth 4 (four) times daily as needed. 08/10/18   Terre Zabriskie C, PA-C  cefPROZIL (CEFZIL) 500 MG tablet Take 1 tablet (500 mg total) by mouth 2 (two) times daily for 10 days. 08/10/18 08/20/18  Daijanae Rafalski C, PA-C  dexlansoprazole (DEXILANT) 60 MG capsule Take 1 capsule (60 mg total) by mouth daily. 03/28/18   Kathyrn Drown, MD  Dextromethorphan-Guaifenesin (MUCINEX DM MAXIMUM STRENGTH) 60-1200 MG TB12 Take by mouth as needed. Reported on 11/25/2015    [provider]  diphenhydrAMINE (SOMINEX) 25 MG tablet Take 25 mg by mouth at bedtime as needed for sleep. Reported on 11/25/2015    [provider]  ELIQUIS 5 MG TABS tablet TAKE (1) TABLET BY MOUTH TWICE DAILY. 03/28/18   Kathyrn Drown, MD  furosemide (LASIX) 20 MG tablet Take 20 mg by mouth 2 (two) times daily.  05/08/14   [provider]  lisinopril (PRINIVIL,ZESTRIL) 2.5 MG tablet Take 1 tablet (2.5 mg total) by mouth daily. 05/06/18   Kathyrn Drown,  MD  metoprolol succinate (TOPROL-XL) 100 MG 24 hr tablet Take 1 tablet (100 mg total) by mouth daily. Take with or immediately following a meal. 05/06/18   Luking, Elayne Snare, MD  simethicone (MYLICON) 329 MG chewable tablet 125 mg. Take 125 mg by mouth every 6 (six) hours as needed.    [provider]  spironolactone (ALDACTONE) 25 MG tablet 25 mg. Take 1 tablet (25 mg total) by  mouth daily. 10/30/12   [provider]  triamcinolone cream (KENALOG) 0.1 % Apply 1 application topically 2 (two) times daily. As needed 02/13/18   Kathyrn Drown, MD    Family History Family History  Problem Relation Age of Onset  . Atrial fibrillation Father   . Other Father        s/p pacemaker     Social History Social History   Tobacco Use  . Smoking status: Never Smoker  . Smokeless tobacco: Never Used  Substance Use Topics  . Alcohol use: No  . Drug use: No     Allergies   Levaquin [levofloxacin in d5w]   Review of Systems Review of Systems  Constitutional: Negative for activity change, appetite change, chills, fatigue and fever.  HENT: Positive for congestion, rhinorrhea, sinus pressure and sore throat. Negative for ear pain and trouble swallowing.   Eyes: Negative for discharge and redness.  Respiratory: Positive for cough. Negative for chest tightness and shortness of breath.   Cardiovascular: Negative for chest pain.  Gastrointestinal: Negative for abdominal pain, diarrhea, nausea and vomiting.  Musculoskeletal: Negative for myalgias.  Skin: Negative for rash.  Neurological: Negative for dizziness, light-headedness and headaches.     Physical Exam Triage Vital Signs ED Triage Vitals  Enc Vitals Group     BP 08/10/18 1013 (!) 111/47     Pulse Rate 08/10/18 1013 75     Resp 08/10/18 1013 18     Temp 08/10/18 1013 98.6 F (37 C)     Temp Source 08/10/18 1013 Oral     SpO2 08/10/18 1013 96 %     Weight 08/10/18 1016 296 lb (134.3 kg)     Height --      Head Circumference --      Peak Flow --      Pain Score 08/10/18 1016 2     Pain Loc --      Pain Edu? --      Excl. in [place]? --    No data found.  Updated Vital Signs BP (!) 111/47   Pulse 75   Temp 98.6 F (37 C) (Oral)   Resp 18   Wt 296 lb (134.3 kg)   SpO2 96%   BMI 41.28 kg/m   Visual Acuity Right Eye Distance:   Left Eye Distance:   Bilateral Distance:    Right Eye  Near:   Left Eye Near:    Bilateral Near:     Physical Exam Vitals signs and nursing note reviewed.  Constitutional:      Appearance: He is well-developed.  HENT:     Head: Normocephalic and atraumatic.     Ears:     Comments: Bilateral ears without tenderness to palpation of external auricle, tragus and mastoid, EAC's without erythema or swelling, TM's with good bony landmarks and cone of light. Non erythematous.    Nose:     Comments: Nasal mucosa erythematous, nonswollen turbinates    Mouth/Throat:     Comments: Oral mucosa pink and moist, no tonsillar enlargement or  exudate. Posterior pharynx patent and erythematous, no uvula deviation or swelling. Normal phonation. Eyes:     Conjunctiva/sclera: Conjunctivae normal.  Neck:     Musculoskeletal: Neck supple.  Cardiovascular:     Rate and Rhythm: Normal rate and regular rhythm.     Heart sounds: No murmur.  Pulmonary:     Effort: Pulmonary effort is normal. No respiratory distress.     Comments: Breath sounds present throughout all lung fields, slightly decreased throughout left lung fields, no wheezing or rales auscultated Abdominal:     Palpations: Abdomen is soft.     Tenderness: There is no abdominal tenderness.  Skin:    General: Skin is warm and dry.  Neurological:     Mental Status: He is alert.      UC Treatments / Results  Labs (all labs ordered are listed, but only abnormal results are displayed) Labs Reviewed  CULTURE, GROUP A STREP [facility])    EKG None  Radiology No results found.  Procedures Procedures (including critical care time)  Medications Ordered in UC Medications - No data to display  Initial Impression / Assessment and Plan / UC Course  I have reviewed the triage vital signs and the nursing notes.  Pertinent labs & imaging results that were available during my care of the patient were reviewed by me and considered in my medical decision making (see chart for details).    URI symptoms  for less than 1 week, most likely viral sinusitis, but given decreased breath sounds, concerning for possible pneumonia.  Patient states he is previously been on Augmentin and this has not worked for him, he has had success with cefprozil.  Will re-prescribe this.  Twice daily for 10 days.  Strep test negative, culture sent.  Also recommended daily allergy pill to help with nasal congestion and drainage.  Cough syrup as needed.  Continue to monitor temperature, breathing and symptoms,Discussed strict return precautions. Patient verbalized understanding and is agreeable with plan.  Final Clinical Impressions(s) / UC Diagnoses   Final diagnoses:  Lower respiratory infection (e.g., bronchitis, pneumonia, pneumonitis, pulmonitis)     Discharge Instructions     Please begin daily allergy pill like zyrtec or claritin to help with congestion and drainage Use cough syrup as needed every 8 hours for cough/congestion Begin cefprozil twice daily for 10 days   ED Prescriptions    Medication Sig Dispense Auth. Provider   cefPROZIL (CEFZIL) 500 MG tablet Take 1 tablet (500 mg total) by mouth 2 (two) times daily for 10 days. 20 tablet Marah Park C, PA-C   brompheniramine-pseudoephedrine-DM 30-2-10 MG/5ML syrup Take 5 mLs by mouth 4 (four) times daily as needed. 120 mL Adrie Picking C, PA-C     Controlled Substance Prescriptions [place] Controlled Substance Registry consulted? Not Applicable   Janith Lima, PA-C 08/10/18 1059    Labrenda Lasky, [place] C, [place] 08/10/18 1100

## 2018-08-10 NOTE — Discharge Instructions (Signed)
Please begin daily allergy pill like zyrtec or claritin to help with congestion and drainage Use cough syrup as needed every 8 hours for cough/congestion Begin cefprozil twice daily for 10 days

## 2018-08-13 LAB — CULTURE, GROUP A STREP (THRC)

## 2018-08-23 ENCOUNTER — Ambulatory Visit (INDEPENDENT_AMBULATORY_CARE_PROVIDER_SITE_OTHER): Payer: Medicare Other | Admitting: *Deleted

## 2018-08-23 DIAGNOSIS — Z23 Encounter for immunization: Secondary | ICD-10-CM

## 2018-09-16 ENCOUNTER — Ambulatory Visit (INDEPENDENT_AMBULATORY_CARE_PROVIDER_SITE_OTHER): Payer: Medicare Other | Admitting: Family Medicine

## 2018-09-16 ENCOUNTER — Encounter: Payer: Self-pay | Admitting: Family Medicine

## 2018-09-16 VITALS — BP 104/80 | Ht 71.0 in | Wt 303.0 lb

## 2018-09-16 DIAGNOSIS — I10 Essential (primary) hypertension: Secondary | ICD-10-CM

## 2018-09-16 DIAGNOSIS — E7849 Other hyperlipidemia: Secondary | ICD-10-CM | POA: Diagnosis not present

## 2018-09-16 DIAGNOSIS — Z7901 Long term (current) use of anticoagulants: Secondary | ICD-10-CM | POA: Diagnosis not present

## 2018-09-16 MED ORDER — APIXABAN 5 MG PO TABS: ORAL_TABLET | ORAL | 5 refills | Status: DC

## 2018-09-16 MED ORDER — DEXLANSOPRAZOLE 60 MG PO CPDR: 1.0000 | DELAYED_RELEASE_CAPSULE | Freq: Every day | ORAL | 5 refills | Status: DC

## 2018-09-16 MED ORDER — LISINOPRIL 2.5 MG PO TABS: 2.5000 mg | ORAL_TABLET | Freq: Every day | ORAL | 6 refills | Status: DC

## 2018-09-16 MED ORDER — METOPROLOL SUCCINATE ER 100 MG PO TB24: 100.0000 mg | ORAL_TABLET | Freq: Every day | ORAL | 6 refills | Status: DC

## 2018-09-16 NOTE — Progress Notes (Signed)
Subjective:    Patient ID: Jeremy Goodman, male    DOB: 1981-07-19, 38 y.o.   MRN: [id number]  HPI Patient is here today to follow up on his chronic health issues.  Patient for blood pressure check up.  The patient does have hypertension.  The patient is on medication.  Patient relates compliance with meds. Todays BP reviewed with the patient. Patient denies issues with medication. Patient relates reasonable diet. Patient tries to minimize salt. Patient aware of BP goals.  Patient here for follow-up regarding cholesterol.  The patient does have hyperlipidemia.  Patient does try to maintain a reasonable diet.  Patient does take the medication on a regular basis.  Denies missing a dose.  The patient denies any obvious side effects.  Prior blood work results reviewed with the patient.  The patient is aware of his cholesterol goals and the need to keep it under good control to lessen the risk of disease.    He has ha history of Hypertension and class one congestive heart failure takes Furosemide 20 mg once per day, Lisinopril 2.5 mg once per day, Metoprolol 100 mg once per day, Spironolactone 25 mg once per day, Eliqus 5 mg once per day.  He does try to eat better.  He has some gastrointestinal reflux and takes Dexilant 60 mg once every other day.  The patient's BMI is calculated.  The patient does have obesity.  The patient does try to some degree staying active and watching diet.  It is in the vital signs and acknowledged.  It is above the recommended BMI for the patient's height and weight.  The patient has been counseled regarding healthy diet, restricted portions, avoiding excessive carbohydrates/sugary foods, and increase physical activity as health permits.  It is in the patient's best interest to lower the risk of secondary illness including heart disease strokes and cancer by losing weight.  The patient acknowledges this information.    Review of Systems  Constitutional: Negative for  activity change.  HENT: Negative for congestion and rhinorrhea.   Respiratory: Negative for cough and shortness of breath.   Cardiovascular: Negative for chest pain.  Gastrointestinal: Negative for abdominal pain, diarrhea, nausea and vomiting.  Genitourinary: Negative for dysuria and hematuria.  Neurological: Negative for weakness and headaches.  Psychiatric/Behavioral: Negative for behavioral problems and confusion.       Objective:   Physical Exam Vitals signs reviewed.  Cardiovascular:     Rate and Rhythm: Normal rate and regular rhythm.     Heart sounds: Normal heart sounds. No murmur.  Pulmonary:     Effort: Pulmonary effort is normal.     Breath sounds: Normal breath sounds.  Lymphadenopathy:     Cervical: No cervical adenopathy.  Neurological:     Mental Status: He is alert.  Psychiatric:        Behavior: Behavior normal.           Assessment & Plan:  Morbid obesity-patient was encouraged to watch diet try to stay active try to lose some weight  History of transposition of great vessels cardiac wise he is stable currently he follows with his specialist at [facility] on a regular basis  HTN- Patient was seen today as part of a visit regarding hypertension. The importance of healthy diet and regular physical activity was discussed. The importance of compliance with medications discussed.  Ideal goal is to keep blood pressure low elevated levels certainly below 759/16 when possible.  The patient was counseled that keeping  blood pressure under control lessen his risk of complications.  The importance of regular follow-ups was discussed with the patient.  Low-salt diet such as DASH recommended.  Regular physical activity was recommended as well.  Patient was advised to keep regular follow-ups.  The patient was seen today as part of an evaluation regarding hyperlipidemia.  Recent lab work has been reviewed with the patient as well as the goals for good  cholesterol care.  In addition to this medications have been discussed the importance of compliance with diet and medications discussed as well.  Finally the patient is aware that poor control of cholesterol, noncompliance can dramatically increase the risk of complications. The patient will keep regular office visits and the patient does agreed to periodic lab work.  Chronic anticoagulant use-we talked at length about the importance of taking this.  No bleeding complications currently.  Check CBC  Follow-up approximately 6 months lab work before that visit 25 minutes was spent with patient today discussing healthcare issues which they came.  More than 50% of this visit-total duration of visit-was spent in counseling and coordination of care.  Please see diagnosis regarding the focus of this coordination and care

## 2018-09-17 LAB — CBC WITH DIFFERENTIAL/PLATELET
Basophils Absolute: 0 10*3/uL (ref 0.0–0.2)
Basos: 0 %
EOS (ABSOLUTE): 0.2 10*3/uL (ref 0.0–0.4)
Eos: 2 %
HEMOGLOBIN: 14.7 g/dL (ref 13.0–17.7)
Hematocrit: 44.1 % (ref 37.5–51.0)
Immature Grans (Abs): 0 10*3/uL (ref 0.0–0.1)
Immature Granulocytes: 0 %
LYMPHS ABS: 2.1 10*3/uL (ref 0.7–3.1)
Lymphs: 29 %
MCH: 30.6 pg (ref 26.6–33.0)
MCHC: 33.3 g/dL (ref 31.5–35.7)
MCV: 92 fL (ref 79–97)
Monocytes Absolute: 0.7 10*3/uL (ref 0.1–0.9)
Monocytes: 9 %
Neutrophils Absolute: 4.4 10*3/uL (ref 1.4–7.0)
Neutrophils: 60 %
Platelets: 229 10*3/uL (ref 150–450)
RBC: 4.81 x10E6/uL (ref 4.14–5.80)
RDW: 12.2 % (ref 11.6–15.4)
WBC: 7.3 10*3/uL (ref 3.4–10.8)

## 2018-09-17 LAB — LIPID PANEL
Chol/HDL Ratio: 3.7 ratio (ref 0.0–5.0)
Cholesterol, Total: 140 mg/dL (ref 100–199)
HDL: 38 mg/dL — ABNORMAL LOW (ref >39)
LDL Calculated: 92 mg/dL (ref 0–99)
Triglycerides: 50 mg/dL (ref 0–149)
VLDL Cholesterol Cal: 10 mg/dL (ref 5–40)

## 2018-09-17 LAB — HEPATIC FUNCTION PANEL
ALT: 22 IU/L (ref 0–44)
AST: 19 IU/L (ref 0–40)
Albumin: 4.5 g/dL (ref 4.0–5.0)
Alkaline Phosphatase: 81 IU/L (ref 39–117)
Bilirubin Total: 0.7 mg/dL (ref 0.0–1.2)
Bilirubin, Direct: 0.24 mg/dL (ref 0.00–0.40)
Total Protein: 7.2 g/dL (ref 6.0–8.5)

## 2018-09-17 LAB — BASIC METABOLIC PANEL
BUN/Creatinine Ratio: 15 (ref 9–20)
BUN: 13 mg/dL (ref 6–20)
CO2: 21 mmol/L (ref 20–29)
CREATININE: 0.85 mg/dL (ref 0.76–1.27)
Calcium: 9.4 mg/dL (ref 8.7–10.2)
Chloride: 101 mmol/L (ref 96–106)
GFR calc Af Amer: 131 mL/min/{1.73_m2} (ref >59)
GFR calc non Af Amer: 113 mL/min/{1.73_m2} (ref >59)
Glucose: 88 mg/dL (ref 65–99)
Potassium: 4.4 mmol/L (ref 3.5–5.2)
SODIUM: 140 mmol/L (ref 134–144)

## 2018-09-26 DIAGNOSIS — Q203 Discordant ventriculoarterial connection: Secondary | ICD-10-CM | POA: Diagnosis not present

## 2018-09-26 DIAGNOSIS — I517 Cardiomegaly: Secondary | ICD-10-CM | POA: Diagnosis not present

## 2018-09-26 DIAGNOSIS — I4892 Unspecified atrial flutter: Secondary | ICD-10-CM | POA: Diagnosis not present

## 2018-09-26 DIAGNOSIS — Z9889 Other specified postprocedural states: Secondary | ICD-10-CM | POA: Diagnosis not present

## 2019-01-22 ENCOUNTER — Other Ambulatory Visit: Payer: Self-pay

## 2019-01-22 ENCOUNTER — Ambulatory Visit (INDEPENDENT_AMBULATORY_CARE_PROVIDER_SITE_OTHER): Payer: Medicare Other | Admitting: Family Medicine

## 2019-01-22 ENCOUNTER — Encounter: Payer: Self-pay | Admitting: Family Medicine

## 2019-01-22 DIAGNOSIS — N3000 Acute cystitis without hematuria: Secondary | ICD-10-CM | POA: Diagnosis not present

## 2019-01-22 MED ORDER — CIPROFLOXACIN HCL 500 MG PO TABS: 500.0000 mg | ORAL_TABLET | Freq: Two times a day (BID) | ORAL | 0 refills | Status: DC

## 2019-01-22 NOTE — Progress Notes (Signed)
   Subjective:    Patient ID: Jeremy Goodman, male    DOB: [birth year], 38 y.o.   MRN: [id number]  Urinary Tract Infection   This is a new problem. Episode onset: a little over one week ago. Associated symptoms comments: Burning with urination, one or two flakes of blood one time. He has tried increased fluids for the symptoms.   History of recurrent urinary tract infection.  Currently gets them about once a year at this point.  Substantial dysuria 1 weeks duration.  Substantial increased urinary frequency.  Some mild low abdominal discomfort.  Perhaps rare hematuria  No major fever chills vomiting nausea   Review of Systems No headache, no major weight loss or weight gain, no chest pain no back pain abdominal pain no change in bowel habits complete ROS otherwise negative     Objective:   Physical Exam   Virtual visit     Assessment & Plan:  Impression probable recurrent UTI.  Discussed.  Will cover with Cipro with solid dose due to history of renal involvement in the past.  Side effects discussed increased hydration encouraged

## 2019-01-23 DIAGNOSIS — Z95810 Presence of automatic (implantable) cardiac defibrillator: Secondary | ICD-10-CM | POA: Diagnosis not present

## 2019-01-23 DIAGNOSIS — Z9889 Other specified postprocedural states: Secondary | ICD-10-CM | POA: Diagnosis not present

## 2019-01-23 DIAGNOSIS — I472 Ventricular tachycardia: Secondary | ICD-10-CM | POA: Diagnosis not present

## 2019-01-23 DIAGNOSIS — Q203 Discordant ventriculoarterial connection: Secondary | ICD-10-CM | POA: Diagnosis not present

## 2019-02-26 DIAGNOSIS — R931 Abnormal findings on diagnostic imaging of heart and coronary circulation: Secondary | ICD-10-CM | POA: Diagnosis not present

## 2019-02-26 DIAGNOSIS — Z95810 Presence of automatic (implantable) cardiac defibrillator: Secondary | ICD-10-CM | POA: Diagnosis not present

## 2019-02-26 DIAGNOSIS — Z1389 Encounter for screening for other disorder: Secondary | ICD-10-CM | POA: Diagnosis not present

## 2019-02-26 DIAGNOSIS — Q203 Discordant ventriculoarterial connection: Secondary | ICD-10-CM | POA: Diagnosis not present

## 2019-03-17 ENCOUNTER — Ambulatory Visit: Payer: Medicare Other | Admitting: Family Medicine

## 2019-03-18 ENCOUNTER — Ambulatory Visit (INDEPENDENT_AMBULATORY_CARE_PROVIDER_SITE_OTHER): Payer: Medicare Other | Admitting: Family Medicine

## 2019-03-18 DIAGNOSIS — I5042 Chronic combined systolic (congestive) and diastolic (congestive) heart failure: Secondary | ICD-10-CM

## 2019-03-18 NOTE — Progress Notes (Signed)
   Subjective:    Patient ID: Jeremy Goodman, male    DOB: 13-Nov-1980, 38 y.o.   MRN: [id number]  Pt took vitals at home. Weight 300.6 lbs, temp 98.1, bp 112/62, and pulse 65.  Hypertension This is a chronic problem. Pertinent negatives include no chest pain, headaches or shortness of breath. Treatments tried: lisinopril, lasix, toprol, spironolactone. There are no compliance problems.   Patient relates he takes his medicine as directed.  Denies any chest tightness pressure pain had recent MRI with the specialist they feel that his congenital heart disease is doing well and stable.  They recommended follow-up every few months  Previous labs reviewed with the patient overall previous labs look good Pt states no concerns today.   Virtual Visit via Video Note  I connected with Jeremy Goodman on 03/18/19 at 10:00 AM EDT by a video enabled telemedicine application and verified that I am speaking with the correct person using two identifiers.  Location: Patient: home Provider: office   I discussed the limitations of evaluation and management by telemedicine and the availability of in person appointments. The patient expressed understanding and agreed to proceed.  History of Present Illness:    Observations/Objective:   Assessment and Plan:   Follow Up Instructions:    I discussed the assessment and treatment plan with the patient. The patient was provided an opportunity to ask questions and all were answered. The patient agreed with the plan and demonstrated an understanding of the instructions.   The patient was advised to call back or seek an in-person evaluation if the symptoms worsen or if the condition fails to improve as anticipated.  I provided 15 minutes of non-face-to-face time during this encounter.      Review of Systems  Constitutional: Negative for activity change.  HENT: Negative for congestion and rhinorrhea.   Respiratory: Negative for cough and shortness of  breath.   Cardiovascular: Negative for chest pain.  Gastrointestinal: Negative for abdominal pain, diarrhea, nausea and vomiting.  Genitourinary: Negative for dysuria and hematuria.  Neurological: Negative for weakness and headaches.  Psychiatric/Behavioral: Negative for behavioral problems and confusion.       Objective:   Physical Exam   Patient had virtual visit Appears to be in no distress Atraumatic Neuro able to relate and oriented No apparent resp distress Color normal Patient with history of heart failure currently no PND no orthopnea I believe it is under good control  Patient with history of morbid obesity he is trying to watch his diet he has lost some weight he is trying to exercise and trying to get his weight under better control     Assessment & Plan:  HTN good control continue current measures watch diet closely patient is losing some weight continue to exercise try to lose weight be within reason given his heart disease.  Follow-up here in several months for an office visit along with flu shot follow-up sooner problems  15 minutes was spent with patient today discussing healthcare issues which they came.  More than 50% of this visit-total duration of visit-was spent in counseling and coordination of care.  Please see diagnosis regarding the focus of this coordination and care

## 2019-03-27 DIAGNOSIS — I5022 Chronic systolic (congestive) heart failure: Secondary | ICD-10-CM | POA: Diagnosis not present

## 2019-03-27 DIAGNOSIS — R739 Hyperglycemia, unspecified: Secondary | ICD-10-CM | POA: Diagnosis not present

## 2019-03-27 DIAGNOSIS — Z6841 Body Mass Index (BMI) 40.0 and over, adult: Secondary | ICD-10-CM | POA: Diagnosis not present

## 2019-04-12 ENCOUNTER — Telehealth: Payer: Self-pay | Admitting: Family Medicine

## 2019-04-12 NOTE — Telephone Encounter (Signed)
I had a discussion with the patient regarding his symptoms having little bit of diarrhea but no fever chills or sweats temperature was 99 felt a little bit bad last night had diarrhea this morning but feeling all right I told him if his symptoms get worse over the next 48 hours we will do a virtual visit along with having him do covid testing warning signs discussed but overall he is stable

## 2019-04-14 ENCOUNTER — Encounter: Payer: Self-pay | Admitting: Family Medicine

## 2019-04-14 DIAGNOSIS — Z95810 Presence of automatic (implantable) cardiac defibrillator: Secondary | ICD-10-CM | POA: Diagnosis not present

## 2019-04-14 DIAGNOSIS — I519 Heart disease, unspecified: Secondary | ICD-10-CM | POA: Diagnosis not present

## 2019-04-17 ENCOUNTER — Ambulatory Visit (INDEPENDENT_AMBULATORY_CARE_PROVIDER_SITE_OTHER): Payer: Medicare Other | Admitting: Family Medicine

## 2019-04-17 ENCOUNTER — Other Ambulatory Visit: Payer: Self-pay

## 2019-04-17 ENCOUNTER — Other Ambulatory Visit: Payer: Self-pay | Admitting: *Deleted

## 2019-04-17 DIAGNOSIS — Z20822 Contact with and (suspected) exposure to covid-19: Secondary | ICD-10-CM

## 2019-04-17 DIAGNOSIS — A084 Viral intestinal infection, unspecified: Secondary | ICD-10-CM

## 2019-04-17 DIAGNOSIS — R6889 Other general symptoms and signs: Secondary | ICD-10-CM | POA: Diagnosis not present

## 2019-04-17 NOTE — Progress Notes (Signed)
   Subjective:    Patient ID: Jeremy Goodman, male    DOB: 12-Dec-1980, 38 y.o.   MRN: [id number]  HPI Patient with several days of diarrhea did have fever over the weekend but the fever is gone denies wheezing difficulty breathing does have some risk factors for COVID  Patient calls with diarrhea and fever for several days. Patient states he is not able to eat much-no appetite. Moderate diarrhea past several days No n no nvomiting Some hot flashes Some sweats Fatigue Gives out with activity Using water  Virtual Visit via Video Note  I connected with [name] on 04/17/19 at 11:00 AM EDT by a video enabled telemedicine application and verified that I am speaking with the correct person using two identifiers.  Location: Patient: home Provider: office   I discussed the limitations of evaluation and management by telemedicine and the availability of in person appointments. The patient expressed understanding and agreed to proceed.  History of Present Illness:    Observations/Objective:   Assessment and Plan:   Follow Up Instructions:    I discussed the assessment and treatment plan with the patient. The patient was provided an opportunity to ask questions and all were answered. The patient agreed with the plan and demonstrated an understanding of the instructions.   The patient was advised to call back or seek an in-person evaluation if the symptoms worsen or if the condition fails to improve as anticipated.  I provided 15 minutes of non-face-to-face time during this encounter.    Review of Systems  Constitutional: Negative for activity change, appetite change and fatigue.  HENT: Negative for congestion and rhinorrhea.   Respiratory: Negative for cough and shortness of breath.   Cardiovascular: Negative for chest pain and leg swelling.  Gastrointestinal: Positive for diarrhea. Negative for abdominal pain, constipation, nausea and vomiting.  Neurological: Negative for  dizziness and headaches.  Psychiatric/Behavioral: Negative for agitation and behavioral problems.  Patient states he had a slight amount of blood in his stool after several bouts of diarrhea not having any currently no fevers no body aches headache or congestion     Objective:   Physical Exam   Today's visit was via telephone Physical exam was not possible for this visit      Assessment & Plan:  Persistent diarrhea for the past 4 days May use Imodium as needed May use diluted Gatorade yogurt applesauce dry cereal bread peanut butter crackers if ongoing troubles notify us  Recommend COVID testing patient will go today

## 2019-04-18 LAB — NOVEL CORONAVIRUS, NAA: SARS-CoV-2, NAA: NOT DETECTED

## 2019-05-02 ENCOUNTER — Other Ambulatory Visit: Payer: Self-pay | Admitting: Family Medicine

## 2019-05-21 DIAGNOSIS — Q203 Discordant ventriculoarterial connection: Secondary | ICD-10-CM | POA: Diagnosis not present

## 2019-05-21 DIAGNOSIS — I5022 Chronic systolic (congestive) heart failure: Secondary | ICD-10-CM | POA: Diagnosis not present

## 2019-06-02 ENCOUNTER — Other Ambulatory Visit: Payer: Self-pay | Admitting: Family Medicine

## 2019-07-04 ENCOUNTER — Other Ambulatory Visit: Payer: Self-pay | Admitting: Family Medicine

## 2019-07-04 ENCOUNTER — Telehealth: Payer: Self-pay | Admitting: Family Medicine

## 2019-07-04 MED ORDER — METOPROLOL SUCCINATE ER 100 MG PO TB24: 100.0000 mg | ORAL_TABLET | Freq: Every day | ORAL | 3 refills | Status: DC

## 2019-07-04 MED ORDER — LISINOPRIL 2.5 MG PO TABS: 2.5000 mg | ORAL_TABLET | Freq: Every day | ORAL | 3 refills | Status: DC

## 2019-07-04 MED ORDER — APIXABAN 5 MG PO TABS: ORAL_TABLET | ORAL | 3 refills | Status: DC

## 2019-07-04 NOTE — Telephone Encounter (Signed)
Refills sent to pharmacy. No voicemail set up; unable to leave message

## 2019-07-04 NOTE — Telephone Encounter (Signed)
Patient requesting refills on eliquis 5mg . Lisinopril 2.5 mg, metoptolol 100 mg called into [facility]

## 2019-07-04 NOTE — Telephone Encounter (Signed)
May have 4 refills each follow-up by March

## 2019-07-08 NOTE — Telephone Encounter (Signed)
Patient notified

## 2019-07-31 ENCOUNTER — Other Ambulatory Visit: Payer: Self-pay | Admitting: Family Medicine

## 2019-08-05 DIAGNOSIS — Z95810 Presence of automatic (implantable) cardiac defibrillator: Secondary | ICD-10-CM | POA: Diagnosis not present

## 2019-08-05 DIAGNOSIS — I519 Heart disease, unspecified: Secondary | ICD-10-CM | POA: Diagnosis not present

## 2019-08-05 DIAGNOSIS — Z23 Encounter for immunization: Secondary | ICD-10-CM | POA: Diagnosis not present

## 2019-10-23 DIAGNOSIS — I5022 Chronic systolic (congestive) heart failure: Secondary | ICD-10-CM | POA: Diagnosis not present

## 2019-10-23 DIAGNOSIS — I4892 Unspecified atrial flutter: Secondary | ICD-10-CM | POA: Diagnosis not present

## 2019-10-23 DIAGNOSIS — Z9889 Other specified postprocedural states: Secondary | ICD-10-CM | POA: Diagnosis not present

## 2019-10-23 DIAGNOSIS — Q203 Discordant ventriculoarterial connection: Secondary | ICD-10-CM | POA: Diagnosis not present

## 2019-10-23 DIAGNOSIS — Q243 Pulmonary infundibular stenosis: Secondary | ICD-10-CM | POA: Diagnosis not present

## 2019-11-03 ENCOUNTER — Other Ambulatory Visit: Payer: Self-pay | Admitting: Family Medicine

## 2019-11-03 DIAGNOSIS — Z23 Encounter for immunization: Secondary | ICD-10-CM | POA: Diagnosis not present

## 2019-11-03 NOTE — Telephone Encounter (Signed)
30-day on each needs follow-up visit

## 2019-11-04 NOTE — Telephone Encounter (Signed)
Scheduled 4/13   Pt also wants to make office aware 1st covid shot yesterday.

## 2019-11-04 NOTE — Telephone Encounter (Signed)
Tried to contact pt vm not set up

## 2019-11-10 ENCOUNTER — Encounter: Payer: Self-pay | Admitting: Family Medicine

## 2019-11-14 DIAGNOSIS — I4892 Unspecified atrial flutter: Secondary | ICD-10-CM | POA: Diagnosis not present

## 2019-11-14 DIAGNOSIS — Z95810 Presence of automatic (implantable) cardiac defibrillator: Secondary | ICD-10-CM | POA: Diagnosis not present

## 2019-11-25 ENCOUNTER — Ambulatory Visit (INDEPENDENT_AMBULATORY_CARE_PROVIDER_SITE_OTHER): Payer: Medicare Other | Admitting: Family Medicine

## 2019-11-25 ENCOUNTER — Encounter: Payer: Self-pay | Admitting: Family Medicine

## 2019-11-25 ENCOUNTER — Other Ambulatory Visit: Payer: Self-pay

## 2019-11-25 VITALS — BP 118/62 | HR 65 | Temp 97.0°F | Wt 298.0 lb

## 2019-11-25 DIAGNOSIS — Z79899 Other long term (current) drug therapy: Secondary | ICD-10-CM | POA: Diagnosis not present

## 2019-11-25 DIAGNOSIS — I10 Essential (primary) hypertension: Secondary | ICD-10-CM

## 2019-11-25 DIAGNOSIS — E7849 Other hyperlipidemia: Secondary | ICD-10-CM

## 2019-11-25 DIAGNOSIS — R7303 Prediabetes: Secondary | ICD-10-CM | POA: Diagnosis not present

## 2019-11-25 MED ORDER — APIXABAN 5 MG PO TABS: ORAL_TABLET | ORAL | 5 refills | Status: DC

## 2019-11-25 MED ORDER — DEXILANT 60 MG PO CPDR: 1.0000 | DELAYED_RELEASE_CAPSULE | Freq: Every day | ORAL | 5 refills | Status: DC

## 2019-11-25 MED ORDER — FUROSEMIDE 20 MG PO TABS: 20.0000 mg | ORAL_TABLET | Freq: Two times a day (BID) | ORAL | 5 refills | Status: DC

## 2019-11-25 MED ORDER — METOPROLOL SUCCINATE ER 100 MG PO TB24: 100.0000 mg | ORAL_TABLET | Freq: Every day | ORAL | 5 refills | Status: DC

## 2019-11-25 MED ORDER — LISINOPRIL 2.5 MG PO TABS: 2.5000 mg | ORAL_TABLET | Freq: Every day | ORAL | 5 refills | Status: DC

## 2019-11-25 NOTE — Progress Notes (Signed)
   Subjective:    Patient ID: Jeremy Goodman, male    DOB: [birth year], 39 y.o.   MRN: [id number]  HPI Patient calls in today for a check up on his medications.  He has underlying heart disease.  He is followed by specialist States recently he has been doing well with his breathing Denies swelling in the legs Suffers with morbid obesity Trying to watch his intake to some degree Patient reports his vitals this am are:  bp 118/62 Pulse 65 Temp 97 Weight 298lbs  No concerns.  Virtual Visit via Video Note  I connected with Jeremy Goodman on 11/25/19 at  8:20 AM EDT by a video enabled telemedicine application and verified that I am speaking with the correct person using two identifiers.  Location: Patient: home Provider: office   I discussed the limitations of evaluation and management by telemedicine and the availability of in person appointments. The patient expressed understanding and agreed to proceed.  History of Present Illness:    Observations/Objective:   Assessment and Plan:   Follow Up Instructions:    I discussed the assessment and treatment plan with the patient. The patient was provided an opportunity to ask questions and all were answered. The patient agreed with the plan and demonstrated an understanding of the instructions.   The patient was advised to call back or seek an in-person evaluation if the symptoms worsen or if the condition fails to improve as anticipated.  I provided 20 minutes of non-face-to-face time during this encounter.     Review of Systems     Objective:   Physical Exam   Patient had virtual visit Appears to be in no distress Atraumatic Neuro able to relate and oriented No apparent resp distress Color normal      Assessment & Plan:  1. Essential hypertension Blood pressure under good control continue current measures watch salt diet stay active  2. Morbid obesity ([place]) Portion control food selection minimize calories try to  lose weight  3. Prediabetes Careful with starches continue dietary choices staying active check A1c  4. Other hyperlipidemia Lab work ordered await results  Heart disease followed by [facility] cardiology stable currently

## 2019-12-01 DIAGNOSIS — Z23 Encounter for immunization: Secondary | ICD-10-CM | POA: Diagnosis not present

## 2019-12-03 DIAGNOSIS — Z79899 Other long term (current) drug therapy: Secondary | ICD-10-CM | POA: Diagnosis not present

## 2019-12-03 DIAGNOSIS — R7303 Prediabetes: Secondary | ICD-10-CM | POA: Diagnosis not present

## 2019-12-03 DIAGNOSIS — I10 Essential (primary) hypertension: Secondary | ICD-10-CM | POA: Diagnosis not present

## 2019-12-03 DIAGNOSIS — E7849 Other hyperlipidemia: Secondary | ICD-10-CM | POA: Diagnosis not present

## 2019-12-04 LAB — LIPID PANEL
Chol/HDL Ratio: 4 ratio (ref 0.0–5.0)
Cholesterol, Total: 162 mg/dL (ref 100–199)
HDL: 41 mg/dL (ref >39)
LDL Chol Calc (NIH): 106 mg/dL — ABNORMAL HIGH (ref 0–99)
Triglycerides: 79 mg/dL (ref 0–149)
VLDL Cholesterol Cal: 15 mg/dL (ref 5–40)

## 2019-12-04 LAB — HEPATIC FUNCTION PANEL
ALT: 19 IU/L (ref 0–44)
AST: 25 IU/L (ref 0–40)
Albumin: 4.8 g/dL (ref 4.0–5.0)
Alkaline Phosphatase: 118 IU/L — ABNORMAL HIGH (ref 39–117)
Bilirubin Total: 0.9 mg/dL (ref 0.0–1.2)
Bilirubin, Direct: 0.21 mg/dL (ref 0.00–0.40)
Total Protein: 8.3 g/dL (ref 6.0–8.5)

## 2019-12-04 LAB — BASIC METABOLIC PANEL
BUN/Creatinine Ratio: 12 (ref 9–20)
BUN: 11 mg/dL (ref 6–20)
CO2: 25 mmol/L (ref 20–29)
Calcium: 9.6 mg/dL (ref 8.7–10.2)
Chloride: 100 mmol/L (ref 96–106)
Creatinine, Ser: 0.95 mg/dL (ref 0.76–1.27)
GFR calc Af Amer: 118 mL/min/{1.73_m2} (ref >59)
GFR calc non Af Amer: 102 mL/min/{1.73_m2} (ref >59)
Glucose: 80 mg/dL (ref 65–99)
Potassium: 4.6 mmol/L (ref 3.5–5.2)
Sodium: 139 mmol/L (ref 134–144)

## 2019-12-04 LAB — CBC WITH DIFFERENTIAL/PLATELET

## 2019-12-04 LAB — HEMOGLOBIN A1C

## 2019-12-17 DIAGNOSIS — I472 Ventricular tachycardia: Secondary | ICD-10-CM | POA: Diagnosis not present

## 2019-12-17 DIAGNOSIS — Q203 Discordant ventriculoarterial connection: Secondary | ICD-10-CM | POA: Diagnosis not present

## 2019-12-17 DIAGNOSIS — I519 Heart disease, unspecified: Secondary | ICD-10-CM | POA: Diagnosis not present

## 2019-12-17 DIAGNOSIS — Z9889 Other specified postprocedural states: Secondary | ICD-10-CM | POA: Diagnosis not present

## 2020-02-12 DIAGNOSIS — Z95810 Presence of automatic (implantable) cardiac defibrillator: Secondary | ICD-10-CM | POA: Diagnosis not present

## 2020-02-12 DIAGNOSIS — I519 Heart disease, unspecified: Secondary | ICD-10-CM | POA: Diagnosis not present

## 2020-04-15 ENCOUNTER — Encounter: Payer: Self-pay | Admitting: Emergency Medicine

## 2020-04-15 ENCOUNTER — Ambulatory Visit: Payer: Self-pay

## 2020-04-15 ENCOUNTER — Ambulatory Visit
Admission: EM | Admit: 2020-04-15 | Discharge: 2020-04-15 | Disposition: A | Discharge status: Home / Self Care | Payer: Medicare Other | Attending: Emergency Medicine | Admitting: Emergency Medicine

## 2020-04-15 ENCOUNTER — Other Ambulatory Visit: Payer: Self-pay

## 2020-04-15 DIAGNOSIS — N3001 Acute cystitis with hematuria: Secondary | ICD-10-CM | POA: Diagnosis not present

## 2020-04-15 LAB — POCT URINALYSIS DIP (MANUAL ENTRY)
Bilirubin, UA: NEGATIVE
Glucose, UA: NEGATIVE mg/dL
Ketones, POC UA: NEGATIVE mg/dL
Nitrite, UA: NEGATIVE
Protein Ur, POC: NEGATIVE mg/dL
Spec Grav, UA: 1.02 (ref 1.010–1.025)
Urobilinogen, UA: 1 E.U./dL
pH, UA: 7 (ref 5.0–8.0)

## 2020-04-15 MED ORDER — CIPROFLOXACIN HCL 500 MG PO TABS: 500.0000 mg | ORAL_TABLET | Freq: Two times a day (BID) | ORAL | 0 refills | Status: AC

## 2020-04-15 NOTE — ED Triage Notes (Signed)
Burning with urination x3 days, blood in urine today. Hx of UTI's

## 2020-04-15 NOTE — Discharge Instructions (Addendum)
Urine culture sent.  We will call you with the results.   Push fluids and get plenty of rest.   Take antibiotic as directed and to completion Follow up with PCP if symptoms persists Return here or go to ER if you have any new or worsening symptoms such as fever, worsening abdominal pain, nausea/vomiting, flank pain, etc... 

## 2020-04-15 NOTE — ED Provider Notes (Signed)
[facility]   CC: Burning with urination  SUBJECTIVE:  Jeremy Goodman is a 39 y.o. male who presented to the urgent care for complaint of dysuria for the past 3 days.  Report of blood in urine today.  Patient denies a precipitating event, recent sexual encounter, excessive caffeine intake.  Denies abdominal/flank pain.  Has tried OTC medications without relief.  Symptoms are made worse with urination.  Admits to similar symptoms in the past.  Denies fever, chills, nausea, vomiting, abdominal pain, flank pain.  LMP: No LMP for male patient.  ROS: As in HPI.  All other pertinent ROS negative.     Past Medical History:  Diagnosis Date  . Anginal pain ([place])   . C. difficile diarrhea    2 weeks ago prior to admission 1/20 tx'ed with Flagyl   . CHF (congestive heart failure) ([place])   . HTN (hypertension) 08/21/2014  . Transposition of great vessel    Past Surgical History:  Procedure Laterality Date  . CORONARY ARTERY BYPASS GRAFT    . IMPLANTABLE CARDIOVERTER DEFIBRILLATOR IMPLANT     Allergies  Allergen Reactions  . Levaquin [Levofloxacin In D5w] Other (See Comments)    Hallucinations, and the medication affects his muscles.   No current facility-administered medications on file prior to encounter.   Current Outpatient Medications on File Prior to Encounter  Medication Sig Dispense Refill  . acetaminophen (TYLENOL) 325 MG tablet Place 2 tablets (650 mg total) into feeding tube every 4 (four) hours as needed.    Marland Kitchen apixaban (ELIQUIS) 5 MG TABS tablet TAKE (1) TABLET BY MOUTH TWICE DAILY. 60 tablet 5  . brompheniramine-pseudoephedrine-DM 30-2-10 MG/5ML syrup Take 5 mLs by mouth 4 (four) times daily as needed. (Patient not taking: Reported on 03/18/2019) 120 mL 0  . dexlansoprazole (DEXILANT) 60 MG capsule Take 1 capsule (60 mg total) by mouth daily. 30 capsule 5  . Dextromethorphan-Guaifenesin (MUCINEX DM MAXIMUM STRENGTH) 60-1200 MG TB12 Take by mouth as needed. Reported on  11/25/2015    . diphenhydrAMINE (SOMINEX) 25 MG tablet Take 25 mg by mouth at bedtime as needed for sleep. Reported on 11/25/2015    . furosemide (LASIX) 20 MG tablet Take 1 tablet (20 mg total) by mouth 2 (two) times daily. 30 tablet 5  . lisinopril (ZESTRIL) 2.5 MG tablet Take 1 tablet (2.5 mg total) by mouth daily. 30 tablet 5  . metoprolol succinate (TOPROL-XL) 100 MG 24 hr tablet Take 1 tablet (100 mg total) by mouth daily. Take with or immediately following a meal. 30 tablet 5  . simethicone (MYLICON) 676 MG chewable tablet 125 mg. Take 125 mg by mouth every 6 (six) hours as needed.    Marland Kitchen spironolactone (ALDACTONE) 25 MG tablet 25 mg. Take 1 tablet (25 mg total) by mouth daily.     Social History   Socioeconomic History  . Marital status: Single    Spouse name: Not on file  . Number of children: Not on file  . Years of education: Not on file  . Highest education level: Not on file  Occupational History  . Not on file  Tobacco Use  . Smoking status: Never Smoker  . Smokeless tobacco: Never Used  Substance and Sexual Activity  . Alcohol use: No  . Drug use: No  . Sexual activity: Not Currently  Other Topics Concern  . Not on file  Social History Narrative  . Not on file   Social Determinants of Health   Financial Resource Strain:   .  Difficulty of Paying Living Expenses: Not on file  Food Insecurity:   . Worried About Charity fundraiser in the Last Year: Not on file  . Ran Out of Food in the Last Year: Not on file  Transportation Needs:   . Lack of Transportation (Medical): Not on file  . Lack of Transportation (Non-Medical): Not on file  Physical Activity:   . Days of Exercise per Week: Not on file  . Minutes of Exercise per Session: Not on file  Stress:   . Feeling of Stress : Not on file  Social Connections:   . Frequency of Communication with Friends and Family: Not on file  . Frequency of Social Gatherings with Friends and Family: Not on file  . Attends  Religious Services: Not on file  . Active Member of Clubs or Organizations: Not on file  . Attends Archivist Meetings: Not on file  . Marital Status: Not on file  Intimate Partner Violence:   . Fear of Current or Ex-Partner: Not on file  . Emotionally Abused: Not on file  . Physically Abused: Not on file  . Sexually Abused: Not on file   Family History  Problem Relation Age of Onset  . Atrial fibrillation Father   . Other Father        s/p pacemaker     OBJECTIVE:  Vitals:   04/15/20 1343 04/15/20 1348  BP:  127/78  Pulse:  70  Resp:  19  Temp:  98.7 F (37.1 C)  TempSrc:  Oral  SpO2:  93%  Weight: (!) 305 lb (138.3 kg)   Height: 5\' 10"  (1.778 m)    General appearance: AOx3 in no acute distress HEENT: NCAT.  Oropharynx clear.  Lungs: clear to auscultation bilaterally without adventitious breath sounds Heart: regular rate and rhythm.  Radial pulses 2+ symmetrical bilaterally Abdomen: soft; non-distended; no tenderness; bowel sounds present; no guarding or rebound tenderness Back: no CVA tenderness Extremities: no edema; symmetrical with no gross deformities Skin: warm and dry Neurologic: Ambulates from chair to exam table without difficulty Psychological: alert and cooperative; normal mood and affect  Labs Reviewed  POCT URINALYSIS DIP (MANUAL ENTRY) - Abnormal; Notable for the following components:      Result Value   Blood, UA trace-intact (*)    Leukocytes, UA Trace (*)    All other components within normal limits  URINE CULTURE    ASSESSMENT & PLAN:  1. Acute cystitis with hematuria     Meds ordered this encounter  Medications  . ciprofloxacin (CIPRO) 500 MG tablet    Sig: Take 1 tablet (500 mg total) by mouth every 12 (twelve) hours for 7 days.    Dispense:  14 tablet    Refill:  0   Discharge instructions  Urine culture sent.  We will call you with the results.   Push fluids and get plenty of rest.   Take antibiotic as directed and to  completion Follow up with PCP if symptoms persists Return here or go to ER if you have any new or worsening symptoms such as fever, worsening abdominal pain, nausea/vomiting, flank pain, etc...  Outlined signs and symptoms indicating need for more acute intervention. Patient verbalized understanding. After Visit Summary given.  Note: This document was prepared using Dragon voice recognition software and may include unintentional dictation errors.    Emerson Monte, FNP 04/15/20 1413

## 2020-04-17 LAB — URINE CULTURE
Culture: <10000 — AB
Special Requests: NORMAL

## 2020-04-28 ENCOUNTER — Telehealth: Payer: Self-pay | Admitting: Family Medicine

## 2020-04-28 DIAGNOSIS — R7303 Prediabetes: Secondary | ICD-10-CM

## 2020-04-28 DIAGNOSIS — E7849 Other hyperlipidemia: Secondary | ICD-10-CM

## 2020-04-28 DIAGNOSIS — Z79899 Other long term (current) drug therapy: Secondary | ICD-10-CM

## 2020-04-28 DIAGNOSIS — I10 Essential (primary) hypertension: Secondary | ICD-10-CM

## 2020-04-28 NOTE — Telephone Encounter (Signed)
Patient has appointment 10/11 for 6 month follow up and needing labs done

## 2020-05-21 NOTE — Telephone Encounter (Signed)
Blood work ordered in Epic. Patient notified. 

## 2020-05-21 NOTE — Telephone Encounter (Signed)
Lipid, liver, metabolic 7 

## 2020-05-24 ENCOUNTER — Encounter: Payer: Self-pay | Admitting: Family Medicine

## 2020-05-24 ENCOUNTER — Other Ambulatory Visit: Payer: Self-pay

## 2020-05-24 ENCOUNTER — Ambulatory Visit (INDEPENDENT_AMBULATORY_CARE_PROVIDER_SITE_OTHER): Payer: Medicare Other | Admitting: Family Medicine

## 2020-05-24 VITALS — BP 134/86 | HR 78 | Temp 95.5°F | Wt 313.2 lb

## 2020-05-24 DIAGNOSIS — Z23 Encounter for immunization: Secondary | ICD-10-CM | POA: Diagnosis not present

## 2020-05-24 DIAGNOSIS — R7303 Prediabetes: Secondary | ICD-10-CM | POA: Diagnosis not present

## 2020-05-24 DIAGNOSIS — I10 Essential (primary) hypertension: Secondary | ICD-10-CM

## 2020-05-24 DIAGNOSIS — E7849 Other hyperlipidemia: Secondary | ICD-10-CM | POA: Diagnosis not present

## 2020-05-24 DIAGNOSIS — Z79899 Other long term (current) drug therapy: Secondary | ICD-10-CM | POA: Diagnosis not present

## 2020-05-24 MED ORDER — APIXABAN 5 MG PO TABS
ORAL_TABLET | ORAL | 1 refills | Status: DC
Start: 2020-05-24 — End: 2020-09-14

## 2020-05-24 MED ORDER — FUROSEMIDE 20 MG PO TABS
20.0000 mg | ORAL_TABLET | Freq: Two times a day (BID) | ORAL | 1 refills | Status: DC
Start: 2020-05-24 — End: 2020-09-14

## 2020-05-24 MED ORDER — LISINOPRIL 2.5 MG PO TABS
2.5000 mg | ORAL_TABLET | Freq: Every day | ORAL | 1 refills | Status: DC
Start: 2020-05-24 — End: 2020-09-14

## 2020-05-24 MED ORDER — METOPROLOL SUCCINATE ER 100 MG PO TB24
100.0000 mg | ORAL_TABLET | Freq: Every day | ORAL | 1 refills | Status: DC
Start: 2020-05-24 — End: 2020-09-14

## 2020-05-24 MED ORDER — DEXILANT 60 MG PO CPDR
1.0000 | DELAYED_RELEASE_CAPSULE | Freq: Every day | ORAL | 1 refills | Status: DC
Start: 2020-05-24 — End: 2020-12-01

## 2020-05-24 NOTE — Progress Notes (Signed)
° °  Subjective:    Patient ID: Jeremy Goodman, male    DOB: September 23, 1980, 39 y.o.   MRN: [id number]  HPI Pt here for 6 month follow up. Pt does not check BP on regular basis but no issues. Pt needing refills on meds.  Prediabetes  Other hyperlipidemia - Plan: Lipid panel  Primary hypertension  Essential hypertension - Plan: Basic metabolic panel  High risk medication use - Plan: Hepatic function panel  Need for vaccination - Plan: Flu Vaccine QUAD 6+ mos PF IM (Fluarix Quad PF)  Patient has underlying heart disease Being considered for possible transplant by [facility] Patient also suffers with significant obesity as well trying to watch portions to a degree has not been exercising much lately does walk his dog has been dealing with the death of his dad  Review of Systems  Constitutional: Negative for activity change, fatigue and fever.  HENT: Negative for congestion and rhinorrhea.   Respiratory: Negative for cough and shortness of breath.   Cardiovascular: Negative for chest pain and leg swelling.  Gastrointestinal: Negative for abdominal pain, diarrhea and nausea.  Genitourinary: Negative for dysuria and hematuria.  Neurological: Negative for weakness and headaches.  Psychiatric/Behavioral: Negative for agitation and behavioral problems.       Objective:   Physical Exam Vitals reviewed.  Constitutional:      General: He is not in acute distress. HENT:     Head: Normocephalic and atraumatic.  Eyes:     General:        Right eye: No discharge.        Left eye: No discharge.  Neck:     Trachea: No tracheal deviation.  Cardiovascular:     Rate and Rhythm: Normal rate and regular rhythm.     Heart sounds: Normal heart sounds. No murmur heard.   Pulmonary:     Effort: Pulmonary effort is normal. No respiratory distress.     Breath sounds: Normal breath sounds.  Lymphadenopathy:     Cervical: No cervical adenopathy.  Skin:    General: Skin is warm and dry.    Neurological:     Mental Status: He is alert.     Coordination: Coordination normal.  Psychiatric:        Behavior: Behavior normal.            Assessment & Plan:  1. Prediabetes A1c in the past is look good.  We will monitor closely.  Minimize starches in the diet.  Try to stay active  2. Other hyperlipidemia Elevated cholesterol in the past watch diet check lab work await results - Lipid panel  3. Primary hypertension Blood pressure good control currently.  Watch diet closely.  Continue medications.  4. Essential hypertension Blood pressures see above - Basic metabolic panel  5. High risk medication use Liver function indicated - Hepatic function panel  6. Need for vaccination Flu shot today - Flu Vaccine QUAD 6+ mos PF IM (Fluarix Quad PF) Morbid obesity important for patient to minimize starches in the diet and try to lose weight 7. Morbid obesity ([place]) Associated with his blood pressure and cholesterol Medications were updated today Await lab results Follow-up 6 months

## 2020-05-25 LAB — BASIC METABOLIC PANEL
BUN/Creatinine Ratio: 21 — ABNORMAL HIGH (ref 9–20)
BUN: 20 mg/dL (ref 6–20)
CO2: 25 mmol/L (ref 20–29)
Calcium: 10.1 mg/dL (ref 8.7–10.2)
Chloride: 100 mmol/L (ref 96–106)
Creatinine, Ser: 0.95 mg/dL (ref 0.76–1.27)
GFR calc Af Amer: 118 mL/min/{1.73_m2} (ref >59)
GFR calc non Af Amer: 102 mL/min/{1.73_m2} (ref >59)
Glucose: 93 mg/dL (ref 65–99)
Potassium: 4.9 mmol/L (ref 3.5–5.2)
Sodium: 138 mmol/L (ref 134–144)

## 2020-05-25 LAB — HEPATIC FUNCTION PANEL
ALT: 18 IU/L (ref 0–44)
AST: 18 IU/L (ref 0–40)
Albumin: 4.6 g/dL (ref 4.0–5.0)
Alkaline Phosphatase: 98 IU/L (ref 44–121)
Bilirubin Total: 0.5 mg/dL (ref 0.0–1.2)
Bilirubin, Direct: 0.16 mg/dL (ref 0.00–0.40)
Total Protein: 8.1 g/dL (ref 6.0–8.5)

## 2020-05-25 LAB — LIPID PANEL
Chol/HDL Ratio: 3.9 ratio (ref 0.0–5.0)
Cholesterol, Total: 166 mg/dL (ref 100–199)
HDL: 43 mg/dL (ref >39)
LDL Chol Calc (NIH): 112 mg/dL — ABNORMAL HIGH (ref 0–99)
Triglycerides: 52 mg/dL (ref 0–149)
VLDL Cholesterol Cal: 11 mg/dL (ref 5–40)

## 2020-08-11 DIAGNOSIS — Z95810 Presence of automatic (implantable) cardiac defibrillator: Secondary | ICD-10-CM | POA: Diagnosis not present

## 2020-09-14 ENCOUNTER — Other Ambulatory Visit: Payer: Self-pay | Admitting: Family Medicine

## 2020-09-22 DIAGNOSIS — Z9889 Other specified postprocedural states: Secondary | ICD-10-CM | POA: Diagnosis not present

## 2020-09-22 DIAGNOSIS — R0602 Shortness of breath: Secondary | ICD-10-CM | POA: Diagnosis not present

## 2020-09-22 DIAGNOSIS — Q243 Pulmonary infundibular stenosis: Secondary | ICD-10-CM | POA: Diagnosis not present

## 2020-09-22 DIAGNOSIS — Z13220 Encounter for screening for lipoid disorders: Secondary | ICD-10-CM | POA: Diagnosis not present

## 2020-09-22 DIAGNOSIS — Q203 Discordant ventriculoarterial connection: Secondary | ICD-10-CM | POA: Diagnosis not present

## 2020-09-22 DIAGNOSIS — I5022 Chronic systolic (congestive) heart failure: Secondary | ICD-10-CM | POA: Diagnosis not present

## 2020-10-13 DIAGNOSIS — Z9889 Other specified postprocedural states: Secondary | ICD-10-CM | POA: Diagnosis not present

## 2020-10-13 DIAGNOSIS — Q203 Discordant ventriculoarterial connection: Secondary | ICD-10-CM | POA: Diagnosis not present

## 2020-10-13 DIAGNOSIS — I519 Heart disease, unspecified: Secondary | ICD-10-CM | POA: Diagnosis not present

## 2020-11-09 DIAGNOSIS — Z8679 Personal history of other diseases of the circulatory system: Secondary | ICD-10-CM | POA: Diagnosis not present

## 2020-11-09 DIAGNOSIS — Z9889 Other specified postprocedural states: Secondary | ICD-10-CM | POA: Diagnosis not present

## 2020-11-09 DIAGNOSIS — Z95810 Presence of automatic (implantable) cardiac defibrillator: Secondary | ICD-10-CM | POA: Diagnosis not present

## 2020-11-23 ENCOUNTER — Ambulatory Visit: Payer: Medicare Other | Admitting: Family Medicine

## 2020-12-01 ENCOUNTER — Encounter: Payer: Self-pay | Admitting: Family Medicine

## 2020-12-01 ENCOUNTER — Other Ambulatory Visit: Payer: Self-pay

## 2020-12-01 ENCOUNTER — Ambulatory Visit (INDEPENDENT_AMBULATORY_CARE_PROVIDER_SITE_OTHER): Payer: Medicare Other | Admitting: Family Medicine

## 2020-12-01 VITALS — BP 110/70 | HR 79 | Temp 96.8°F | Ht 70.0 in | Wt 326.0 lb

## 2020-12-01 DIAGNOSIS — I5042 Chronic combined systolic (congestive) and diastolic (congestive) heart failure: Secondary | ICD-10-CM

## 2020-12-01 DIAGNOSIS — R7303 Prediabetes: Secondary | ICD-10-CM

## 2020-12-01 DIAGNOSIS — I480 Paroxysmal atrial fibrillation: Secondary | ICD-10-CM | POA: Diagnosis not present

## 2020-12-01 DIAGNOSIS — I10 Essential (primary) hypertension: Secondary | ICD-10-CM | POA: Diagnosis not present

## 2020-12-01 DIAGNOSIS — E7849 Other hyperlipidemia: Secondary | ICD-10-CM | POA: Diagnosis not present

## 2020-12-01 MED ORDER — FUROSEMIDE 20 MG PO TABS: 20.0000 mg | ORAL_TABLET | Freq: Two times a day (BID) | ORAL | 1 refills | Status: DC

## 2020-12-01 MED ORDER — DEXLANSOPRAZOLE 60 MG PO CPDR: 1.0000 | DELAYED_RELEASE_CAPSULE | Freq: Every day | ORAL | 1 refills | Status: DC

## 2020-12-01 MED ORDER — SPIRONOLACTONE 25 MG PO TABS: 25.0000 mg | ORAL_TABLET | Freq: Once | ORAL | 1 refills | Status: DC

## 2020-12-01 MED ORDER — LISINOPRIL 2.5 MG PO TABS: 2.5000 mg | ORAL_TABLET | Freq: Every day | ORAL | 1 refills | Status: DC

## 2020-12-01 MED ORDER — ELIQUIS 5 MG PO TABS: ORAL_TABLET | ORAL | 1 refills | Status: DC

## 2020-12-01 MED ORDER — METOPROLOL SUCCINATE ER 100 MG PO TB24: ORAL_TABLET | ORAL | 1 refills | Status: DC

## 2020-12-01 NOTE — Progress Notes (Signed)
   Subjective:    Patient ID: Jeremy Goodman, male    DOB: Nov 05, 1980, 40 y.o.   MRN: [id number]  Hypertension This is a chronic problem. Pertinent negatives include no chest pain, headaches or shortness of breath. Treatments tried: lisinopril, spironolactone, metoprolol.    stressed at times-takes care of his mom on a regular basis and this does provide some stress occas sharp pains chest-occasionally gets sharp chest pains does get out of breath if he pushes himself too much denies angina symptoms  Energy low at times-relates a lot of fatigue and tiredness but denies falling asleep during the day  Moods vary- finds himself stressed but denies being depressed  Dietary ok-varies-tries to be healthy with his choices tries to watch portions States heart failure has been under good control follows with cardiologist on a regular basis   Review of Systems  Constitutional: Negative for activity change.  HENT: Negative for congestion and rhinorrhea.   Respiratory: Negative for cough and shortness of breath.   Cardiovascular: Negative for chest pain.  Gastrointestinal: Negative for abdominal pain, diarrhea, nausea and vomiting.  Genitourinary: Negative for dysuria and hematuria.  Neurological: Negative for weakness and headaches.  Psychiatric/Behavioral: Negative for behavioral problems and confusion.       Objective:   Physical Exam Vitals reviewed.  Constitutional:      General: He is not in acute distress. HENT:     Head: Normocephalic and atraumatic.  Eyes:     General:        Right eye: No discharge.        Left eye: No discharge.  Neck:     Trachea: No tracheal deviation.  Cardiovascular:     Rate and Rhythm: Normal rate. Rhythm irregular.     Heart sounds: Normal heart sounds. No murmur heard.   Pulmonary:     Effort: Pulmonary effort is normal. No respiratory distress.     Breath sounds: Normal breath sounds.  Lymphadenopathy:     Cervical: No cervical adenopathy.   Skin:    General: Skin is warm and dry.  Neurological:     Mental Status: He is alert.     Coordination: Coordination normal.  Psychiatric:        Behavior: Behavior normal.   No sign of heart failure on today's exam under good control        Assessment & Plan:  1. Primary hypertension Continue medication check lab work await results - Basic metabolic panel - Magnesium  2. Other hyperlipidemia No need to check cholesterol currently but it is not advantageous for the patient to eat healthy stay active we will check labs later this year on this - Basic metabolic panel - Magnesium  3. Prediabetes His sugars have been under good control A1c previously looked good watch diet minimize carbohydrates - Basic metabolic panel - Magnesium  4. Class 1 congestive heart failure, chronic, combined ([place]) Under good control currently continue current medication  5. Paroxysmal atrial fibrillation ([facility]) Heart rate controlled on blood thinners to prevent strokes no complications currently  6. Morbid obesity ([place]) Watching portions staying active trying to lose weight via activity

## 2020-12-02 LAB — BASIC METABOLIC PANEL
BUN/Creatinine Ratio: 21 — ABNORMAL HIGH (ref 9–20)
BUN: 18 mg/dL (ref 6–20)
CO2: 19 mmol/L — ABNORMAL LOW (ref 20–29)
Calcium: 9.7 mg/dL (ref 8.7–10.2)
Chloride: 101 mmol/L (ref 96–106)
Creatinine, Ser: 0.85 mg/dL (ref 0.76–1.27)
Glucose: 83 mg/dL (ref 65–99)
Potassium: 4.2 mmol/L (ref 3.5–5.2)
Sodium: 138 mmol/L (ref 134–144)
eGFR: 114 mL/min/{1.73_m2} (ref >59)

## 2020-12-02 LAB — MAGNESIUM: Magnesium: 2 mg/dL (ref 1.6–2.3)

## 2021-02-10 DIAGNOSIS — I519 Heart disease, unspecified: Secondary | ICD-10-CM | POA: Diagnosis not present

## 2021-02-10 DIAGNOSIS — Z95810 Presence of automatic (implantable) cardiac defibrillator: Secondary | ICD-10-CM | POA: Diagnosis not present

## 2021-02-24 DIAGNOSIS — Z9889 Other specified postprocedural states: Secondary | ICD-10-CM | POA: Diagnosis not present

## 2021-02-24 DIAGNOSIS — I5022 Chronic systolic (congestive) heart failure: Secondary | ICD-10-CM | POA: Diagnosis not present

## 2021-02-24 DIAGNOSIS — Q203 Discordant ventriculoarterial connection: Secondary | ICD-10-CM | POA: Diagnosis not present

## 2021-04-01 DIAGNOSIS — Z23 Encounter for immunization: Secondary | ICD-10-CM | POA: Diagnosis not present

## 2021-04-22 ENCOUNTER — Telehealth: Payer: Self-pay | Admitting: Family Medicine

## 2021-04-22 NOTE — Telephone Encounter (Signed)
Called to schedule AWV with [facility]. Patients mother stated she will have her son RTN our call to schedule. Please schedule this appt if pt calls the office.   Thanks

## 2021-04-25 DIAGNOSIS — Z45018 Encounter for adjustment and management of other part of cardiac pacemaker: Secondary | ICD-10-CM | POA: Diagnosis not present

## 2021-04-25 DIAGNOSIS — I519 Heart disease, unspecified: Secondary | ICD-10-CM | POA: Diagnosis not present

## 2021-05-17 ENCOUNTER — Ambulatory Visit (INDEPENDENT_AMBULATORY_CARE_PROVIDER_SITE_OTHER): Payer: Medicare Other | Admitting: Family Medicine

## 2021-05-17 ENCOUNTER — Other Ambulatory Visit: Payer: Self-pay

## 2021-05-17 ENCOUNTER — Encounter: Payer: Self-pay | Admitting: Family Medicine

## 2021-05-17 VITALS — BP 106/72 | HR 74 | Temp 97.6°F | Ht 70.0 in | Wt 320.0 lb

## 2021-05-17 DIAGNOSIS — E7849 Other hyperlipidemia: Secondary | ICD-10-CM

## 2021-05-17 DIAGNOSIS — Z23 Encounter for immunization: Secondary | ICD-10-CM | POA: Diagnosis not present

## 2021-05-17 DIAGNOSIS — I519 Heart disease, unspecified: Secondary | ICD-10-CM | POA: Diagnosis not present

## 2021-05-17 DIAGNOSIS — R7303 Prediabetes: Secondary | ICD-10-CM | POA: Diagnosis not present

## 2021-05-17 DIAGNOSIS — I10 Essential (primary) hypertension: Secondary | ICD-10-CM

## 2021-05-17 MED ORDER — LISINOPRIL 2.5 MG PO TABS: 2.5000 mg | ORAL_TABLET | Freq: Every day | ORAL | 1 refills | Status: DC

## 2021-05-17 MED ORDER — METOPROLOL SUCCINATE ER 100 MG PO TB24: ORAL_TABLET | ORAL | 1 refills | Status: DC

## 2021-05-17 MED ORDER — SPIRONOLACTONE 25 MG PO TABS: 25.0000 mg | ORAL_TABLET | Freq: Once | ORAL | 1 refills | Status: DC

## 2021-05-17 MED ORDER — APIXABAN 5 MG PO TABS: ORAL_TABLET | ORAL | 1 refills | Status: DC

## 2021-05-17 MED ORDER — FUROSEMIDE 20 MG PO TABS: 20.0000 mg | ORAL_TABLET | Freq: Two times a day (BID) | ORAL | 1 refills | Status: DC

## 2021-05-17 MED ORDER — DEXLANSOPRAZOLE 60 MG PO CPDR: 1.0000 | DELAYED_RELEASE_CAPSULE | Freq: Every day | ORAL | 1 refills | Status: DC

## 2021-05-17 NOTE — Progress Notes (Signed)
   Subjective:    Patient ID: Jeremy Goodman, male    DOB: 1981/08/08, 40 y.o.   MRN: [id number]  Hypertension  prediabetes Patient for blood pressure check up.  The patient does have hypertension.    Medication compliance-no trouble with medication compliance  Blood pressure control recently-monitors blood pressure when he sees specialists  Dietary compliance-could be doing better on diet  The patient's BMI is calculated.  The patient does have obesity.  The patient does try to some degree staying active and watching diet.  It is in the vital signs and acknowledged.  It is above the recommended BMI for the patient's height and weight.  The patient has been counseled regarding healthy diet, restricted portions, avoiding excessive carbohydrates/sugary foods, and increase physical activity as health permits.  It is in the patient's best interest to lower the risk of secondary illness including heart disease strokes and cancer by losing weight.  The patient acknowledges this information.  I have encouraged him to start doing some walking to help with his weight and portion control  Prediabetes - Plan: Lipid panel, Hepatic function panel, Basic metabolic panel, Hemoglobin A1c  Morbid obesity ([place]) - Plan: Lipid panel, Hepatic function panel, Basic metabolic panel, Hemoglobin A1c  Other hyperlipidemia - Plan: Lipid panel, Hepatic function panel, Basic metabolic panel, Hemoglobin A1c  Primary hypertension - Plan: Lipid panel, Hepatic function panel, Basic metabolic panel, Hemoglobin A1c  Immunization due - Plan: Flu Vaccine QUAD 108mo+IM (Fluarix, Fluzone & Alfiuria Quad PF)     Review of Systems     Objective:   Physical Exam  General-in no acute distress Eyes-no discharge Lungs-respiratory rate normal, CTA CV-no murmurs,RRR Extremities skin warm dry no edema Neuro grossly normal Behavior normal, alert       Assessment & Plan:  1. Prediabetes Portion control fitting and  walking 10 minutes at a time 2-3 times daily follow-up by March - Lipid panel - Hepatic function panel - Basic metabolic panel - Hemoglobin A1c  2. Morbid obesity ([place]) Portion control, avoiding unhealthy foods, fitting and walking on a regular basis, trying to bring his weight down a good 10 to 20 pounds over the next 5 months - Lipid panel - Hepatic function panel - Basic metabolic panel - Hemoglobin A1c  3. Other hyperlipidemia Minimize fried foods fatty foods check cholesterol - Lipid panel - Hepatic function panel - Basic metabolic panel - Hemoglobin A1c  4. Primary hypertension Blood pressure good control continue current measures refills given - Lipid panel - Hepatic function panel - Basic metabolic panel - Hemoglobin A1c  5. Immunization due Today - Flu Vaccine QUAD 14mo+IM (Fluarix, Fluzone & Alfiuria Quad PF)  Does take Eliquis no bleeding issues Healthy diet recommended

## 2021-05-18 LAB — HEPATIC FUNCTION PANEL
ALT: 18 IU/L (ref 0–44)
AST: 20 IU/L (ref 0–40)
Albumin: 4.6 g/dL (ref 4.0–5.0)
Alkaline Phosphatase: 103 IU/L (ref 44–121)
Bilirubin Total: 0.9 mg/dL (ref 0.0–1.2)
Bilirubin, Direct: 0.24 mg/dL (ref 0.00–0.40)
Total Protein: 7.9 g/dL (ref 6.0–8.5)

## 2021-05-18 LAB — LIPID PANEL
Chol/HDL Ratio: 4.4 ratio (ref 0.0–5.0)
Cholesterol, Total: 162 mg/dL (ref 100–199)
HDL: 37 mg/dL — ABNORMAL LOW (ref >39)
LDL Chol Calc (NIH): 111 mg/dL — ABNORMAL HIGH (ref 0–99)
Triglycerides: 71 mg/dL (ref 0–149)
VLDL Cholesterol Cal: 14 mg/dL (ref 5–40)

## 2021-05-18 LAB — BASIC METABOLIC PANEL
BUN/Creatinine Ratio: 19 (ref 9–20)
BUN: 15 mg/dL (ref 6–20)
CO2: 21 mmol/L (ref 20–29)
Calcium: 9.7 mg/dL (ref 8.7–10.2)
Chloride: 101 mmol/L (ref 96–106)
Creatinine, Ser: 0.8 mg/dL (ref 0.76–1.27)
Glucose: 84 mg/dL (ref 70–99)
Potassium: 4.4 mmol/L (ref 3.5–5.2)
Sodium: 138 mmol/L (ref 134–144)
eGFR: 116 mL/min/{1.73_m2} (ref >59)

## 2021-05-18 LAB — HEMOGLOBIN A1C
Est. average glucose Bld gHb Est-mCnc: 120 mg/dL
Hgb A1c MFr Bld: 5.8 % — ABNORMAL HIGH (ref 4.8–5.6)

## 2021-07-06 ENCOUNTER — Telehealth: Payer: Self-pay | Admitting: Family Medicine

## 2021-07-06 MED ORDER — PANTOPRAZOLE SODIUM 40 MG PO TBEC: DELAYED_RELEASE_TABLET | ORAL | 1 refills | Status: DC

## 2021-07-06 NOTE — Telephone Encounter (Signed)
Letter from insurance stating that Dexilant 60 mg not covered. Alternative formulary drug Pantoprazole; per PCP this should work just as well. Discontinue Dexilant and send in Pantoprazole 40 mg take one daily #90 3 refills. Left message to return call to let pt know Dexilant has been denied and we will send in pantoprazole.

## 2021-07-20 NOTE — Telephone Encounter (Signed)
Contacted [place] to see if patient picked up Pantoprazole; pharmacy confirmed pick up

## 2021-07-25 DIAGNOSIS — Z45018 Encounter for adjustment and management of other part of cardiac pacemaker: Secondary | ICD-10-CM | POA: Diagnosis not present

## 2021-09-01 ENCOUNTER — Other Ambulatory Visit: Payer: Self-pay | Admitting: Family Medicine

## 2021-09-23 ENCOUNTER — Encounter (HOSPITAL_COMMUNITY): Payer: Self-pay

## 2021-09-23 ENCOUNTER — Emergency Department (HOSPITAL_COMMUNITY)
Admission: EM | Admit: 2021-09-23 | Discharge: 2021-09-23 | Disposition: A | Discharge status: Home / Self Care | Payer: Medicare Other | Attending: Emergency Medicine | Admitting: Emergency Medicine

## 2021-09-23 ENCOUNTER — Other Ambulatory Visit: Payer: Self-pay

## 2021-09-23 ENCOUNTER — Emergency Department (HOSPITAL_COMMUNITY): Payer: Medicare Other

## 2021-09-23 DIAGNOSIS — R42 Dizziness and giddiness: Secondary | ICD-10-CM | POA: Diagnosis not present

## 2021-09-23 DIAGNOSIS — I10 Essential (primary) hypertension: Secondary | ICD-10-CM | POA: Insufficient documentation

## 2021-09-23 DIAGNOSIS — Z79899 Other long term (current) drug therapy: Secondary | ICD-10-CM | POA: Diagnosis not present

## 2021-09-23 DIAGNOSIS — R0989 Other specified symptoms and signs involving the circulatory and respiratory systems: Secondary | ICD-10-CM | POA: Diagnosis not present

## 2021-09-23 DIAGNOSIS — I517 Cardiomegaly: Secondary | ICD-10-CM | POA: Diagnosis not present

## 2021-09-23 DIAGNOSIS — R002 Palpitations: Secondary | ICD-10-CM | POA: Insufficient documentation

## 2021-09-23 DIAGNOSIS — H81399 Other peripheral vertigo, unspecified ear: Secondary | ICD-10-CM

## 2021-09-23 DIAGNOSIS — Z7901 Long term (current) use of anticoagulants: Secondary | ICD-10-CM | POA: Insufficient documentation

## 2021-09-23 DIAGNOSIS — E119 Type 2 diabetes mellitus without complications: Secondary | ICD-10-CM | POA: Insufficient documentation

## 2021-09-23 DIAGNOSIS — R000 Tachycardia, unspecified: Secondary | ICD-10-CM | POA: Diagnosis not present

## 2021-09-23 DIAGNOSIS — I491 Atrial premature depolarization: Secondary | ICD-10-CM | POA: Diagnosis not present

## 2021-09-23 DIAGNOSIS — R079 Chest pain, unspecified: Secondary | ICD-10-CM | POA: Diagnosis not present

## 2021-09-23 LAB — CBC WITH DIFFERENTIAL/PLATELET
Abs Immature Granulocytes: 0.09 10*3/uL — ABNORMAL HIGH (ref 0.00–0.07)
Basophils Absolute: 0 10*3/uL (ref 0.0–0.1)
Basophils Relative: 0 %
Eosinophils Absolute: 0.1 10*3/uL (ref 0.0–0.5)
Eosinophils Relative: 1 %
HCT: 48.9 % (ref 39.0–52.0)
Hemoglobin: 15.9 g/dL (ref 13.0–17.0)
Immature Granulocytes: 1 %
Lymphocytes Relative: 11 %
Lymphs Abs: 1.3 10*3/uL (ref 0.7–4.0)
MCH: 32.1 pg (ref 26.0–34.0)
MCHC: 32.5 g/dL (ref 30.0–36.0)
MCV: 98.6 fL (ref 80.0–100.0)
Monocytes Absolute: 0.8 10*3/uL (ref 0.1–1.0)
Monocytes Relative: 6 %
Neutro Abs: 9.7 10*3/uL — ABNORMAL HIGH (ref 1.7–7.7)
Neutrophils Relative %: 81 %
Platelets: 178 10*3/uL (ref 150–400)
RBC: 4.96 MIL/uL (ref 4.22–5.81)
RDW: 13.1 % (ref 11.5–15.5)
WBC: 12 10*3/uL — ABNORMAL HIGH (ref 4.0–10.5)
nRBC: 0 % (ref 0.0–0.2)

## 2021-09-23 LAB — BASIC METABOLIC PANEL
Anion gap: 9 (ref 5–15)
BUN: 16 mg/dL (ref 6–20)
CO2: 25 mmol/L (ref 22–32)
Calcium: 9.5 mg/dL (ref 8.9–10.3)
Chloride: 103 mmol/L (ref 98–111)
Creatinine, Ser: 0.74 mg/dL (ref 0.61–1.24)
GFR, Estimated: >60 mL/min (ref >60)
Glucose, Bld: 104 mg/dL — ABNORMAL HIGH (ref 70–99)
Potassium: 3.8 mmol/L (ref 3.5–5.1)
Sodium: 137 mmol/L (ref 135–145)

## 2021-09-23 MED ORDER — METOPROLOL TARTRATE 50 MG PO TABS
100.0000 mg | ORAL_TABLET | ORAL | Status: AC
  Administered 2021-09-23: 100 mg via ORAL
  Filled 2021-09-23: qty 2

## 2021-09-23 MED ORDER — MECLIZINE HCL 25 MG PO TABS: 25.0000 mg | ORAL_TABLET | Freq: Three times a day (TID) | ORAL | 0 refills | Status: DC | PRN

## 2021-09-23 MED ORDER — ONDANSETRON 4 MG PO TBDP: 4.0000 mg | ORAL_TABLET | Freq: Three times a day (TID) | ORAL | 0 refills | Status: AC | PRN

## 2021-09-23 MED ORDER — ONDANSETRON 4 MG PO TBDP
4.0000 mg | ORAL_TABLET | Freq: Once | ORAL | Status: AC
  Administered 2021-09-23: 4 mg via ORAL
  Filled 2021-09-23: qty 1

## 2021-09-23 MED ORDER — MECLIZINE HCL 12.5 MG PO TABS
25.0000 mg | ORAL_TABLET | Freq: Once | ORAL | Status: AC
  Administered 2021-09-23: 25 mg via ORAL
  Filled 2021-09-23: qty 2

## 2021-09-23 NOTE — Discharge Instructions (Addendum)
Meclizine every 8 hours as needed for dizziness  Zofran every 6 hours as needed for nausea  Drink plenty of clear liquids  Return to the emergency department for severe worsening symptoms

## 2021-09-23 NOTE — ED Triage Notes (Signed)
Patient brought in by [facility] for palpitations.  Called out for lightheadedness and palpitations.  Patient showed bigeminal PVCs and 1 run of Vtach that lasted 5 beats.  EMS gave 150 mg of amiodarone.  Patient then presented with atrial fibrillation on the monitor.  Patient has an extensive cardiac hx.  Patient is conscious and alert at triage.

## 2021-09-23 NOTE — ED Provider Notes (Signed)
[facility] EMERGENCY DEPARTMENT Provider Note   CSN: [id number] Arrival date & time: 09/23/21  1359     History  Chief Complaint  Patient presents with   Palpitations    Jeremy Goodman is a 41 y.o. male.   Palpitations  This patient is a 41 year old male, he has a history of transposition of the great vessels, he has had 2 corrective surgeries the most recent of which was at the age of 34.  In 2014 he had a defibrillator placed.  He is currently on Eliquis, he is on an ACE inhibitor, he is on furosemide and metoprolol, he did not take any of his medications yesterday as he forgot.  This morning he woke up and noticed that when he tried to get out of bed he became very dizzy describing vertigo, becoming flushed and nauseated, he went back to the bed and this happened again when he tried to get out of bed again.  It went away each time.  He then felt like he was having palpitations which caused him to call EMS.  Paramedics found the patient to have some sinus tachycardia with some PVCs, they gave him amiodarone after a run of 5 beats of V. tach.  The patient did not have a defibrillatory event.  At this time the patient feels back to his baseline.  He is not having any fevers chills coughing diarrhea or swelling of the legs out of his ordinary.  He did take his medications today including his Eliquis but has not had his metoprolol which she takes at night  Home Medications Prior to Admission medications   Medication Sig Start Date End Date Taking? Authorizing Provider  meclizine (ANTIVERT) 25 MG tablet Take 1 tablet (25 mg total) by mouth 3 (three) times daily as needed for dizziness. 09/23/21  Yes Noemi Chapel, MD  ondansetron (ZOFRAN-ODT) 4 MG disintegrating tablet Take 1 tablet (4 mg total) by mouth every 8 (eight) hours as needed for nausea. 09/23/21  Yes Noemi Chapel, MD  acetaminophen (TYLENOL) 325 MG tablet Place 2 tablets (650 mg total) into feeding tube every 4 (four) hours as needed.  09/03/12   Edmisten, Brooke O, PA-C  Dextromethorphan-Guaifenesin (MUCINEX DM MAXIMUM STRENGTH) 60-1200 MG TB12 Take by mouth as needed. Reported on 11/25/2015    [provider]  diphenhydrAMINE (SOMINEX) 25 MG tablet Take 25 mg by mouth at bedtime as needed for sleep. Reported on 11/25/2015    [provider]  ELIQUIS 5 MG TABS tablet TAKE (1) TABLET BY MOUTH TWICE DAILY. 09/02/21   Kathyrn Drown, MD  empagliflozin (JARDIANCE) 25 MG TABS tablet  09/22/20   [provider]  furosemide (LASIX) 20 MG tablet TAKE ONE TABLET BY MOUTH TWICE DAILY. 09/02/21   Kathyrn Drown, MD  lisinopril (ZESTRIL) 2.5 MG tablet TAKE 1 TABLET BY MOUTH ONCE DAILY. 09/02/21   Kathyrn Drown, MD  metoprolol succinate (TOPROL-XL) 100 MG 24 hr tablet TAKE 1 TABLET BY MOUTH ONCE DAILY WITH A MEAL. 09/02/21   Kathyrn Drown, MD  pantoprazole (PROTONIX) 40 MG tablet Take one tablet po daily 07/06/21   Kathyrn Drown, MD  simethicone (MYLICON) 347 MG chewable tablet 125 mg. Take 125 mg by mouth every 6 (six) hours as needed.    [provider]  spironolactone (ALDACTONE) 25 MG tablet TAKE ONE TABLET BY MOUTH DAILY. 09/02/21   Kathyrn Drown, MD      Allergies    Levaquin [levofloxacin in d5w]  Review of Systems   Review of Systems  Cardiovascular:  Positive for palpitations.  All other systems reviewed and are negative.  Physical Exam Updated Vital Signs BP 125/63    Pulse 66    Temp 98 F (36.7 C) (Oral)    Resp 17    Ht 1.803 m (5\' 11" )    Wt (!) 142.9 kg    SpO2 94%    BMI 43.93 kg/m  Physical Exam Vitals and nursing note reviewed.  Constitutional:      General: He is not in acute distress.    Appearance: He is well-developed.  HENT:     Head: Normocephalic and atraumatic.     Mouth/Throat:     Pharynx: No oropharyngeal exudate.  Eyes:     General: No scleral icterus.       Right eye: No discharge.        Left eye: No discharge.     Conjunctiva/sclera: Conjunctivae  normal.     Pupils: Pupils are equal, round, and reactive to light.  Neck:     Thyroid: No thyromegaly.     Vascular: No JVD.  Cardiovascular:     Rate and Rhythm: Regular rhythm. Tachycardia present.     Heart sounds: Normal heart sounds. No murmur heard.   No friction rub. No gallop.     Comments: Heart rate of 105 bpm, sinus tachycardia, occasional PVCs Pulmonary:     Effort: Pulmonary effort is normal. No respiratory distress.     Breath sounds: Normal breath sounds. No wheezing or rales.  Abdominal:     General: Bowel sounds are normal. There is no distension.     Palpations: Abdomen is soft. There is no mass.     Tenderness: There is no abdominal tenderness.  Musculoskeletal:        General: No tenderness. Normal range of motion.     Cervical back: Normal range of motion and neck supple.  Lymphadenopathy:     Cervical: No cervical adenopathy.  Skin:    General: Skin is warm and dry.     Findings: No erythema or rash.  Neurological:     Mental Status: He is alert.     Coordination: Coordination normal.     Comments: Able to move the patient around in multiple different directions, no inducement of significant nystagmus, the patient has normal speech cranial nerves III through XII and normal strength and sensation in all 4 extremities  Psychiatric:        Behavior: Behavior normal.    ED Results / Procedures / Treatments   Labs (all labs ordered are listed, but only abnormal results are displayed) Labs Reviewed  BASIC METABOLIC PANEL - Abnormal; Notable for the following components:      Result Value   Glucose, Bld 104 (*)    All other components within normal limits  CBC WITH DIFFERENTIAL/PLATELET - Abnormal; Notable for the following components:   WBC 12.0 (*)    Neutro Abs 9.7 (*)    Abs Immature Granulocytes 0.09 (*)    All other components within normal limits    EKG EKG Interpretation  Date/Time:  Friday September 23 2021 14:16:33 EST Ventricular Rate:   102 PR Interval:  178 QRS Duration: 104 QT Interval:  370 QTC Calculation: 482 R Axis:   169 Text Interpretation: Sinus tachycardia Multiple premature complexes, vent & supraven Posterior infarct, acute (LCx) ST depression V1-V3, suggest recording posterior leads unchanged going back > 10 years Confirmed by Noemi Chapel (  [id number]) on 09/23/2021 2:59:01 PM  Radiology DG Chest Port 1 View  Result Date: 09/23/2021 CLINICAL DATA:  Chest pain.  Lightheadedness. EXAM: PORTABLE CHEST 1 VIEW COMPARISON:  09/03/2012 FINDINGS: Two AP portable radiographs. Both are degraded by patient body habitus and AP portable technique. Pacer/AICD device with leads at right atrium and right ventricle. Midline trachea. Moderate cardiomegaly. Suspect progressive marked left hemidiaphragm elevation. No pleural effusion or pneumothorax. Low lung volumes with resultant pulmonary interstitial prominence. No overt congestive failure. Presumed volume loss in the inferior left hemithorax. IMPRESSION: Cardiomegaly and low lung volumes. This accentuates the pulmonary interstitium. Favor progressive marked left hemidiaphragm elevation compared to 2014. PA and lateral radiographs could help more entirely characterize this appearance. Electronically Signed   By: Abigail Miyamoto M.D.   On: 09/23/2021 17:41    Procedures Procedures    Medications Ordered in ED Medications  ondansetron (ZOFRAN-ODT) disintegrating tablet 4 mg (has no administration in time range)  meclizine (ANTIVERT) tablet 25 mg (has no administration in time range)  metoprolol tartrate (LOPRESSOR) tablet 100 mg (100 mg Oral Given 09/23/21 1536)    ED Course/ Medical Decision Making/ A&P                           Medical Decision Making Amount and/or Complexity of Data Reviewed Labs: ordered. Radiology: ordered.  Risk Prescription drug management.   This patient presents to the ED for concern of palpitations, this involves an extensive number of treatment  options, and is a complaint that carries with it a high risk of complications and morbidity.  The differential diagnosis includes arrhythmia, medication noncompliance, did not take his beta-blocker so a tachycardic response would be not unheard of.  Could be electrolytes given the patient is on furosemide and an ACE inhibitor   Co morbidities that complicate the patient evaluation  Obesity, diabetes, hypertension, prior structural heart disease requiring surgery, he has no known history of obstructive heart disease according to the patient   Additional history obtained:  Additional history obtained from electronic medical record and the family member External records from outside source obtained and reviewed including prior echocardiogram in 2014, ejection fraction of around 25%   Lab Tests:  I Ordered, and personally interpreted labs.  The pertinent results include: CBC, metabolic panel, both totally unremarkable, blood count of 12,000 however no signs of infection, no anemia, no renal dysfunction   Imaging Studies ordered:  I ordered imaging studies including portable chest x-ray I independently visualized and interpreted imaging which showed likely left hemidiaphragm elevation, the patient's chronic cardiomegaly likely secondary to prior surgery, no pulmonary infiltrate seen I agree with the radiologist interpretation   Cardiac Monitoring:  The patient was maintained on a cardiac monitor.  I personally viewed and interpreted the cardiac monitored which showed an underlying rhythm of: Normal sinus rhythm, tachycardia improved with beta-blocker   Medicines ordered and prescription drug management:  I ordered medication including metoprolol for tachycardia and palpitations Reevaluation of the patient after these medicines showed that the patient improved I have reviewed the patients home medicines and have made adjustments as needed   Test Considered:  CT scan of the chest but  patient is not having any coughing or shortness of breath at this time, seems unnecessary   Critical Interventions:  Evaluation for cause of palpitations, given medications for his hypertension and tachycardia since he had not had it in 24 hours     Problem List / ED  Course:  Improved significantly, asymptomatic at discharge   Reevaluation:  After the interventions noted above, I reevaluated the patient and found that they have : Improved, the patient has no signs of vertigo, he is able to move around and ambulate   Social Determinants of Health:  None   Dispostion:  After consideration of the diagnostic results and the patients response to treatment, I feel that the patent would benefit from discharge home, patient and family member agreeable to return should symptoms worsen, medications given for home including Zofran and meclizine..          Final Clinical Impression(s) / ED Diagnoses Final diagnoses:  Palpitations  Peripheral vertigo, unspecified laterality    Rx / DC Orders ED Discharge Orders          Ordered    ondansetron (ZOFRAN-ODT) 4 MG disintegrating tablet  Every 8 hours PRN        09/23/21 1915    meclizine (ANTIVERT) 25 MG tablet  3 times daily PRN        09/23/21 1915              Noemi Chapel, MD 09/23/21 [phone]

## 2021-09-23 NOTE — ED Notes (Signed)
ED Provider at bedside. 

## 2021-10-07 ENCOUNTER — Encounter: Payer: Self-pay | Admitting: Family Medicine

## 2021-10-07 ENCOUNTER — Ambulatory Visit (INDEPENDENT_AMBULATORY_CARE_PROVIDER_SITE_OTHER): Payer: Medicare Other | Admitting: Family Medicine

## 2021-10-07 ENCOUNTER — Other Ambulatory Visit: Payer: Self-pay

## 2021-10-07 VITALS — BP 96/67 | Temp 97.1°F | Wt 315.6 lb

## 2021-10-07 DIAGNOSIS — R7303 Prediabetes: Secondary | ICD-10-CM

## 2021-10-07 DIAGNOSIS — N471 Phimosis: Secondary | ICD-10-CM | POA: Diagnosis not present

## 2021-10-07 DIAGNOSIS — I493 Ventricular premature depolarization: Secondary | ICD-10-CM | POA: Diagnosis not present

## 2021-10-07 DIAGNOSIS — I10 Essential (primary) hypertension: Secondary | ICD-10-CM | POA: Diagnosis not present

## 2021-10-07 DIAGNOSIS — R300 Dysuria: Secondary | ICD-10-CM | POA: Diagnosis not present

## 2021-10-07 DIAGNOSIS — Z79899 Other long term (current) drug therapy: Secondary | ICD-10-CM | POA: Diagnosis not present

## 2021-10-07 LAB — POCT URINALYSIS DIPSTICK
Glucose, UA: POSITIVE — AB
Spec Grav, UA: 1.02 (ref 1.010–1.025)
Urobilinogen, UA: 1 E.U./dL
pH, UA: 6 (ref 5.0–8.0)

## 2021-10-07 MED ORDER — FUROSEMIDE 20 MG PO TABS: 20.0000 mg | ORAL_TABLET | Freq: Two times a day (BID) | ORAL | 1 refills | Status: DC

## 2021-10-07 MED ORDER — APIXABAN 5 MG PO TABS: ORAL_TABLET | ORAL | 1 refills | Status: DC

## 2021-10-07 MED ORDER — SPIRONOLACTONE 25 MG PO TABS: 25.0000 mg | ORAL_TABLET | Freq: Every day | ORAL | 1 refills | Status: DC

## 2021-10-07 MED ORDER — LISINOPRIL 2.5 MG PO TABS
2.5000 mg | ORAL_TABLET | Freq: Every day | ORAL | 1 refills | Status: DC
Start: 2021-10-07 — End: 2022-06-01

## 2021-10-07 MED ORDER — METOPROLOL SUCCINATE ER 100 MG PO TB24: ORAL_TABLET | ORAL | 1 refills | Status: DC

## 2021-10-07 MED ORDER — CEPHALEXIN 500 MG PO CAPS: 500.0000 mg | ORAL_CAPSULE | Freq: Three times a day (TID) | ORAL | 0 refills | Status: DC

## 2021-10-07 NOTE — Progress Notes (Signed)
° °  Subjective:    Patient ID: Jeremy Goodman, male    DOB: 11/30/1980, 41 y.o.   MRN: [id number]  HPI Pt was having blood in urine a few weeks ago. Pt takes Lasix BID so unsure of frequency/urgency.  Pt went to ER on 09/23/21 for Vertigo.   Results for orders placed or performed in visit on 10/07/21  POCT Urinalysis Dipstick  Result Value Ref Range   Color, UA     Clarity, UA     Glucose, UA Positive (A) Negative   Bilirubin, UA     Ketones, UA     Spec Grav, UA 1.020 1.010 - 1.025   Blood, UA trace    pH, UA 6.0 5.0 - 8.0   Protein, UA     Urobilinogen, UA 1.0 0.2 or 1.0 E.U./dL   Nitrite, UA     Leukocytes, UA     Appearance     Odor       Review of Systems     Objective:   Physical Exam        Assessment & Plan:  1. Dysuria We will check a urine culture.  Hold off on any type of aggressive treatment is currently, antibiotics for 5 days - POCT Urinalysis Dipstick - Urine Culture  2. Prediabetes Check A1c minimize starches - Hemoglobin W0J - Basic Metabolic Panel (BMET) - Hepatic function panel  3. PVC (premature ventricular contraction) Patient had a short run of V. tach but is implanted defibrillator did not go off.  He has not had any problems since then will follow-up with cardiology later on in March  4. High risk medication use Check lab work - Hemoglobin W1X - Basic Metabolic Panel (BMET) - Hepatic function panel  5. Primary hypertension Blood pressure decent control continue current measures - Hemoglobin B1Y - Basic Metabolic Panel (BMET) - Hepatic function panel  Phimosis referral to urology.  With his background he would need this done in the hospital setting preferably [place] or [facility] long

## 2021-10-08 LAB — BASIC METABOLIC PANEL
BUN/Creatinine Ratio: 18 (ref 9–20)
BUN: 12 mg/dL (ref 6–20)
CO2: 22 mmol/L (ref 20–29)
Calcium: 9.4 mg/dL (ref 8.7–10.2)
Chloride: 101 mmol/L (ref 96–106)
Creatinine, Ser: 0.68 mg/dL — ABNORMAL LOW (ref 0.76–1.27)
Glucose: 77 mg/dL (ref 70–99)
Potassium: 4 mmol/L (ref 3.5–5.2)
Sodium: 137 mmol/L (ref 134–144)
eGFR: 122 mL/min/{1.73_m2} (ref >59)

## 2021-10-08 LAB — HEPATIC FUNCTION PANEL
ALT: 24 IU/L (ref 0–44)
AST: 26 IU/L (ref 0–40)
Albumin: 4.6 g/dL (ref 4.0–5.0)
Alkaline Phosphatase: 89 IU/L (ref 44–121)
Bilirubin Total: 0.8 mg/dL (ref 0.0–1.2)
Bilirubin, Direct: 0.29 mg/dL (ref 0.00–0.40)
Total Protein: 8 g/dL (ref 6.0–8.5)

## 2021-10-08 LAB — HEMOGLOBIN A1C
Est. average glucose Bld gHb Est-mCnc: 114 mg/dL
Hgb A1c MFr Bld: 5.6 % (ref 4.8–5.6)

## 2021-10-10 NOTE — Addendum Note (Signed)
Addended by: Vicente Males on: 10/10/2021 08:28 AM   Modules accepted: Orders

## 2021-10-10 NOTE — Progress Notes (Signed)
Referral placed.

## 2021-10-11 LAB — SPECIMEN STATUS REPORT

## 2021-10-11 LAB — URINE CULTURE

## 2021-10-14 ENCOUNTER — Other Ambulatory Visit: Payer: Self-pay

## 2021-10-14 ENCOUNTER — Ambulatory Visit: Payer: Medicare Other | Admitting: Family Medicine

## 2021-10-14 ENCOUNTER — Ambulatory Visit (INDEPENDENT_AMBULATORY_CARE_PROVIDER_SITE_OTHER): Payer: Medicare Other | Admitting: Family Medicine

## 2021-10-14 VITALS — BP 125/62 | HR 84 | Temp 98.3°F | Ht 71.0 in | Wt 315.0 lb

## 2021-10-14 DIAGNOSIS — R002 Palpitations: Secondary | ICD-10-CM

## 2021-10-14 NOTE — Progress Notes (Signed)
? ?  Subjective:  ? ? Patient ID: Jeremy Goodman, male    DOB: 01/14/1981, 41 y.o.   MRN: [id number] ? ?HPI ?Palpitations  ?He relates that he has had some palpitations.  Had some the other day went to the ER.  The EMS folks detected 5 beats of V. tach.  His defibrillator did not go off.  They did give him a amiodarone.  He went to the ER for evaluation.  Was released.  Since then his only had a couple spells of palpitation but not severe no shortness of breath or chest pain ? ?Review of Systems ? ?   ?Objective:  ? Physical Exam ?Harsh murmur noted ?No crackles ?Extremities no edema ?Varicose veins noted in the legs ?Heart is regular ? ? ? ?   ?Assessment & Plan:  ?EKG did not show any acute changes ?We will notify his cardiologist ?In my opinion they will need to see him for further evaluation.  Patient has follow-up here every 3 to 4 months. ? ?

## 2021-10-19 DIAGNOSIS — I471 Supraventricular tachycardia: Secondary | ICD-10-CM | POA: Diagnosis not present

## 2021-10-19 DIAGNOSIS — Z95810 Presence of automatic (implantable) cardiac defibrillator: Secondary | ICD-10-CM | POA: Diagnosis not present

## 2021-10-19 DIAGNOSIS — I5022 Chronic systolic (congestive) heart failure: Secondary | ICD-10-CM | POA: Diagnosis not present

## 2021-10-19 DIAGNOSIS — I4892 Unspecified atrial flutter: Secondary | ICD-10-CM | POA: Diagnosis not present

## 2021-11-06 ENCOUNTER — Telehealth: Payer: Self-pay | Admitting: Family Medicine

## 2021-11-06 ENCOUNTER — Encounter: Payer: Self-pay | Admitting: Family Medicine

## 2021-11-06 NOTE — Telephone Encounter (Signed)
A letter was dictated to his specialist at [facility] ?Dr. Jeralyn Bennett ?Cardiologist at [facility] ?Fax number [phone] ?Please fax the letter as well as my most recent office visit within the next couple days he has an office visit on the 31st with them thank you ?

## 2021-11-10 ENCOUNTER — Telehealth: Payer: Self-pay | Admitting: Family Medicine

## 2021-11-10 NOTE — Telephone Encounter (Signed)
?  No answer unable to leave a message for patient to call back and schedule Medicare Annual Wellness Visit (AWV) in office.  ? ?If unable to come into the office for AWV,  please offer to do virtually or by telephone. ? ?No hx of AWV eligible for AWVI per [employer] as of  08/14/2009  ? ?Please schedule at anytime with [facility]Nurse Health Advisor.     ? ?45 minute appointment  ? ?Any questions, please call me at [phone]   ?

## 2021-11-11 DIAGNOSIS — Z9889 Other specified postprocedural states: Secondary | ICD-10-CM | POA: Diagnosis not present

## 2021-11-11 DIAGNOSIS — R0602 Shortness of breath: Secondary | ICD-10-CM | POA: Diagnosis not present

## 2021-11-11 DIAGNOSIS — Q243 Pulmonary infundibular stenosis: Secondary | ICD-10-CM | POA: Diagnosis not present

## 2021-11-11 DIAGNOSIS — Q203 Discordant ventriculoarterial connection: Secondary | ICD-10-CM | POA: Diagnosis not present

## 2021-11-11 DIAGNOSIS — I5020 Unspecified systolic (congestive) heart failure: Secondary | ICD-10-CM | POA: Diagnosis not present

## 2021-12-13 ENCOUNTER — Encounter: Payer: Self-pay | Admitting: Nurse Practitioner

## 2021-12-13 ENCOUNTER — Ambulatory Visit (INDEPENDENT_AMBULATORY_CARE_PROVIDER_SITE_OTHER): Payer: Medicare Other | Admitting: Nurse Practitioner

## 2021-12-13 VITALS — BP 110/70 | HR 88 | Temp 97.4°F | Wt 322.2 lb

## 2021-12-13 DIAGNOSIS — N471 Phimosis: Secondary | ICD-10-CM | POA: Diagnosis not present

## 2021-12-13 DIAGNOSIS — N390 Urinary tract infection, site not specified: Secondary | ICD-10-CM

## 2021-12-13 DIAGNOSIS — R300 Dysuria: Secondary | ICD-10-CM | POA: Diagnosis not present

## 2021-12-13 LAB — GLUCOSE, POCT (MANUAL RESULT ENTRY): POC Glucose: 129 mg/dl — AB (ref 70–99)

## 2021-12-13 LAB — POCT URINALYSIS DIPSTICK
Glucose, UA: POSITIVE — AB
Protein, UA: POSITIVE — AB
Spec Grav, UA: 1.01 (ref 1.010–1.025)
Urobilinogen, UA: 0.2 E.U./dL
pH, UA: 6 (ref 5.0–8.0)

## 2021-12-13 MED ORDER — CLOTRIMAZOLE 1 % EX CREA: 1.0000 "application " | TOPICAL_CREAM | Freq: Two times a day (BID) | CUTANEOUS | 0 refills | Status: AC

## 2021-12-13 MED ORDER — CEPHALEXIN 500 MG PO CAPS: 500.0000 mg | ORAL_CAPSULE | Freq: Three times a day (TID) | ORAL | 0 refills | Status: DC

## 2021-12-13 MED ORDER — CEPHALEXIN 500 MG PO CAPS: 500.0000 mg | ORAL_CAPSULE | Freq: Four times a day (QID) | ORAL | 0 refills | Status: AC

## 2021-12-13 NOTE — Progress Notes (Addendum)
? ?  Subjective:  ? ? Patient ID: Jeremy Goodman, male    DOB: [birth year], 41 y.o.   MRN: [id number] ? ?HPI ? ?41 year old male patient with extensive history congestive heart failure, SVT, pulmonary stenosis, hypertension, prediabetes, morbid obesity, and phimosis presents to clinic with complaints of burning with urination x1 days. Pt states "it burns before, during and after; feels like terrible and having chills and mind isn't focused". Began yesterday morning. Pt has increased fluids and drinking cranberry juice.  ? ?Patient states that he did eat a lot of candy over the weekend and is concerned about diabetes. ? ?Patient has history of phimosis and was referred to urology in [place].  However, today patient states that he would rather be seen in [place] if possible. ? ?Patient denies fever, shortness of breath, chest pain, testicular pain, scrotal pain. ? ?Review of Systems  ?Constitutional:  Positive for chills and fatigue.  ?Genitourinary:  Positive for dysuria.  ?All other systems reviewed and are negative. ? ?   ?Objective:  ? Physical Exam ?Vitals reviewed.  ?Constitutional:   ?   General: He is not in acute distress. ?   Appearance: Normal appearance. He is obese. He is not ill-appearing, toxic-appearing or diaphoretic.  ?HENT:  ?   Head: Normocephalic and atraumatic.  ?Cardiovascular:  ?   Rate and Rhythm: Normal rate and regular rhythm.  ?   Pulses: Normal pulses.  ?   Heart sounds: Murmur heard.  ?Pulmonary:  ?   Effort: Pulmonary effort is normal. No respiratory distress.  ?   Breath sounds: No wheezing.  ?Genitourinary: ?   Comments: Declined exam ?Musculoskeletal:  ?   Comments: Grossly intact  ?Skin: ?   General: Skin is warm.  ?   Capillary Refill: Capillary refill takes less than 2 seconds.  ?Neurological:  ?   Mental Status: He is alert.  ?   Comments: Grossly intact  ?Psychiatric:     ?   Mood and Affect: Mood normal.     ?   Behavior: Behavior normal.  ? ? ?   ?Assessment & Plan:  ? ?1.  Burning with urination ?-Possible UTI however also consider complication related to h/x of phimosis such as balanitis. ?-We we will cover patient for both possible UTI and possible balanitis with Keflex. ?-We will cover possible fungal balanitis with clotrimazole 1% x 7 days. ?-Referral to urology placed again.  Patient states that he rather go to a urologist in [employer]. ?-We will also evaluate CBC, liver, kidney, electrolytes ?-We will also send urine for culture. ?-Low suspicion for STDs due to patient's lifestyle. ?- POCT Urinalysis Dipstick = NO LEUCOCYTES OR NITRATES NOTED, glucose and proteins noted ?- Ambulatory referral to Urology ?- POCT Glucose (CBG) = 129 today (non-fasting) ?- CBC with Differential/Platelet ?- CMP14+EGFR ?- Urine Culture ?- cephALEXin (KEFLEX) 500 MG capsule; Take 1 capsule (500 mg total) by mouth 3 (three) times daily.  Dispense: 15 capsule; Refill: 0 ?-Return to clinic if symptoms do not improve or worsen. ? ? ?  ?Note:  This document was prepared using Dragon voice recognition software and may include unintentional dictation errors. ?Note - This record has been created using [employer].  ?Chart creation errors have been sought, but may not always  ?have been located. Such creation errors do not reflect on  ?the standard of medical care. ? ? ?

## 2021-12-14 LAB — CBC WITH DIFFERENTIAL/PLATELET
Basophils Absolute: 0 10*3/uL (ref 0.0–0.2)
Basos: 0 %
EOS (ABSOLUTE): 0.2 10*3/uL (ref 0.0–0.4)
Eos: 2 %
Hematocrit: 48.8 % (ref 37.5–51.0)
Hemoglobin: 16.3 g/dL (ref 13.0–17.7)
Immature Grans (Abs): 0 10*3/uL (ref 0.0–0.1)
Immature Granulocytes: 0 %
Lymphocytes Absolute: 2 10*3/uL (ref 0.7–3.1)
Lymphs: 24 %
MCH: 31.1 pg (ref 26.6–33.0)
MCHC: 33.4 g/dL (ref 31.5–35.7)
MCV: 93 fL (ref 79–97)
Monocytes Absolute: 0.7 10*3/uL (ref 0.1–0.9)
Monocytes: 8 %
Neutrophils Absolute: 5.7 10*3/uL (ref 1.4–7.0)
Neutrophils: 66 %
Platelets: 227 10*3/uL (ref 150–450)
RBC: 5.24 x10E6/uL (ref 4.14–5.80)
RDW: 12.2 % (ref 11.6–15.4)
WBC: 8.6 10*3/uL (ref 3.4–10.8)

## 2021-12-14 LAB — CMP14+EGFR
ALT: 20 IU/L (ref 0–44)
AST: 22 IU/L (ref 0–40)
Albumin/Globulin Ratio: 1.3 (ref 1.2–2.2)
Albumin: 4.6 g/dL (ref 4.0–5.0)
Alkaline Phosphatase: 101 IU/L (ref 44–121)
BUN/Creatinine Ratio: 15 (ref 9–20)
BUN: 13 mg/dL (ref 6–20)
Bilirubin Total: 0.9 mg/dL (ref 0.0–1.2)
CO2: 23 mmol/L (ref 20–29)
Calcium: 9.6 mg/dL (ref 8.7–10.2)
Chloride: 98 mmol/L (ref 96–106)
Creatinine, Ser: 0.84 mg/dL (ref 0.76–1.27)
Globulin, Total: 3.6 g/dL (ref 1.5–4.5)
Glucose: 97 mg/dL (ref 70–99)
Potassium: 4.3 mmol/L (ref 3.5–5.2)
Sodium: 137 mmol/L (ref 134–144)
Total Protein: 8.2 g/dL (ref 6.0–8.5)
eGFR: 114 mL/min/{1.73_m2} (ref >59)

## 2021-12-15 LAB — URINE CULTURE

## 2021-12-15 LAB — SPECIMEN STATUS REPORT

## 2021-12-29 ENCOUNTER — Ambulatory Visit (INDEPENDENT_AMBULATORY_CARE_PROVIDER_SITE_OTHER): Payer: Medicare Other | Admitting: Urology

## 2021-12-29 ENCOUNTER — Encounter: Payer: Self-pay | Admitting: Urology

## 2021-12-29 VITALS — BP 110/74 | HR 82

## 2021-12-29 DIAGNOSIS — B3742 Candidal balanitis: Secondary | ICD-10-CM | POA: Diagnosis not present

## 2021-12-29 DIAGNOSIS — N471 Phimosis: Secondary | ICD-10-CM

## 2021-12-29 LAB — URINALYSIS, ROUTINE W REFLEX MICROSCOPIC
Bilirubin, UA: NEGATIVE
Ketones, UA: NEGATIVE
Leukocytes,UA: NEGATIVE
Nitrite, UA: NEGATIVE
Protein,UA: NEGATIVE
RBC, UA: NEGATIVE
Specific Gravity, UA: 1.025 (ref 1.005–1.030)
Urobilinogen, Ur: 1 mg/dL (ref 0.2–1.0)
pH, UA: 5.5 (ref 5.0–7.5)

## 2021-12-29 MED ORDER — NYSTATIN 100000 UNIT/GM EX CREA: 1.0000 "application " | TOPICAL_CREAM | Freq: Two times a day (BID) | CUTANEOUS | 1 refills | Status: AC

## 2021-12-29 NOTE — Progress Notes (Signed)
Assessment: 1. Phimosis   2. Candidal balanitis     Plan: Diagnosis and management of phimosis discussed with the patient today. Recommend nystatin cream to the foreskin and glans twice a day for approximately 2 weeks, then as needed. Discussed the importance of keeping the area clean and dry to prevent recurrent Candida balanitis I discussed the option of circumcision.  Given his significant cardiac history, any surgical procedures would likely need to be done at a larger medical facility, preferably [facility]. Return to office as needed.  Chief Complaint:  Chief Complaint  Patient presents with   Phimosis    History of Present Illness:  Jeremy Goodman is a 41 y.o. year old male who is seen in consultation from Mariane Baumgarten, NP for evaluation of phimosis.  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  He reports intermittent problems with irritation of the foreskin.  He has difficulty retracting the foreskin and does have some discomfort associated with erections.  He previously had improvement with topical antifungal therapy.  He is not using anything at the present time.  He is not having any significant urinary symptoms.  He does report frequency which she associates with his diuretic use.  No dysuria or gross hematuria.  He has a remote history of UTIs.   Past Medical History:  Past Medical History:  Diagnosis Date   Anginal pain (HCC)    C. difficile diarrhea    2 weeks ago prior to admission 1/20 tx'ed with Flagyl    CHF (congestive heart failure) ([place])    HTN (hypertension) 08/21/2014   Transposition of great vessel     Past Surgical History:  Past Surgical History:  Procedure Laterality Date   CORONARY ARTERY BYPASS GRAFT     IMPLANTABLE CARDIOVERTER DEFIBRILLATOR IMPLANT      Allergies:  Allergies  Allergen Reactions   Levaquin [Levofloxacin In D5w] Other (See Comments)    Hallucinations, and the medication affects his muscles.    Family History:  Family History  Problem  Relation Age of Onset   Atrial fibrillation Father    Other Father        s/p pacemaker     Social History:  Social History   Tobacco Use   Smoking status: Never   Smokeless tobacco: Never  Substance Use Topics   Alcohol use: No   Drug use: No    Review of symptoms:  Constitutional:  Negative for unexplained weight loss, night sweats, fever, chills ENT:  Negative for nose bleeds, sinus pain, painful swallowing CV:  Negative for chest pain, shortness of breath, exercise intolerance, palpitations, loss of consciousness Resp:  Negative for cough, wheezing, shortness of breath GI:  Negative for nausea, vomiting, diarrhea, bloody stools GU:  Positives noted in HPI; otherwise negative for gross hematuria, dysuria, urinary incontinence Neuro:  Negative for seizures, poor balance, limb weakness, slurred speech Psych:  Negative for lack of energy, depression, anxiety Endocrine:  Negative for polydipsia, polyuria, symptoms of hypoglycemia (dizziness, hunger, sweating) Hematologic:  Negative for anemia, purpura, petechia, prolonged or excessive bleeding, use of anticoagulants  Allergic:  Negative for difficulty breathing or choking as a result of exposure to anything; no shellfish allergy; no allergic response (rash/itch) to materials, foods  Physical exam: BP 110/74   Pulse 82  GENERAL APPEARANCE:  Well appearing, well developed, well nourished, NAD HEENT: Atraumatic, Normocephalic, oropharynx clear. NECK: Supple without lymphadenopathy or thyromegaly. LUNGS: Clear to auscultation bilaterally. HEART: Regular Rate and Rhythm without murmurs, gallops, or rubs. ABDOMEN: Soft,  non-tender, No Masses. EXTREMITIES: Moves all extremities well.  Without clubbing, cyanosis, or edema. NEUROLOGIC:  Alert and oriented x 3, normal gait, CN II-XII grossly intact.  MENTAL STATUS:  Appropriate. BACK:  Non-tender to palpation.  No CVAT SKIN:  Warm, dry and intact.   GU: Penis:  uncircumcised,  phimosis present; minimal erythema of foreskin and glans Meatus: Normal Scrotum: normal, no masses   Results: U/A: Dipstick negative

## 2022-01-19 DIAGNOSIS — Q243 Pulmonary infundibular stenosis: Secondary | ICD-10-CM | POA: Diagnosis not present

## 2022-01-19 DIAGNOSIS — Q203 Discordant ventriculoarterial connection: Secondary | ICD-10-CM | POA: Diagnosis not present

## 2022-01-19 DIAGNOSIS — I5020 Unspecified systolic (congestive) heart failure: Secondary | ICD-10-CM | POA: Diagnosis not present

## 2022-01-19 DIAGNOSIS — Z9889 Other specified postprocedural states: Secondary | ICD-10-CM | POA: Diagnosis not present

## 2022-04-27 DIAGNOSIS — Z95810 Presence of automatic (implantable) cardiac defibrillator: Secondary | ICD-10-CM | POA: Diagnosis not present

## 2022-04-27 DIAGNOSIS — Z4502 Encounter for adjustment and management of automatic implantable cardiac defibrillator: Secondary | ICD-10-CM | POA: Diagnosis not present

## 2022-05-16 DIAGNOSIS — I8393 Asymptomatic varicose veins of bilateral lower extremities: Secondary | ICD-10-CM | POA: Diagnosis not present

## 2022-05-16 DIAGNOSIS — I5042 Chronic combined systolic (congestive) and diastolic (congestive) heart failure: Secondary | ICD-10-CM | POA: Diagnosis not present

## 2022-05-16 DIAGNOSIS — Z8774 Personal history of (corrected) congenital malformations of heart and circulatory system: Secondary | ICD-10-CM | POA: Diagnosis not present

## 2022-05-16 DIAGNOSIS — I5020 Unspecified systolic (congestive) heart failure: Secondary | ICD-10-CM | POA: Diagnosis not present

## 2022-06-01 ENCOUNTER — Other Ambulatory Visit: Payer: Self-pay | Admitting: Family Medicine

## 2022-06-27 DIAGNOSIS — Z95810 Presence of automatic (implantable) cardiac defibrillator: Secondary | ICD-10-CM | POA: Diagnosis not present

## 2022-07-02 DIAGNOSIS — Z95810 Presence of automatic (implantable) cardiac defibrillator: Secondary | ICD-10-CM | POA: Diagnosis not present

## 2022-08-16 ENCOUNTER — Ambulatory Visit (INDEPENDENT_AMBULATORY_CARE_PROVIDER_SITE_OTHER): Payer: Medicare Other

## 2022-08-16 DIAGNOSIS — Z23 Encounter for immunization: Secondary | ICD-10-CM

## 2022-08-28 ENCOUNTER — Other Ambulatory Visit: Payer: Self-pay | Admitting: Family Medicine

## 2022-08-28 NOTE — Telephone Encounter (Signed)
May have 6 months on all 

## 2022-09-13 ENCOUNTER — Ambulatory Visit (INDEPENDENT_AMBULATORY_CARE_PROVIDER_SITE_OTHER): Payer: Medicare Other | Admitting: Family Medicine

## 2022-09-13 VITALS — BP 110/68 | Wt 321.4 lb

## 2022-09-13 DIAGNOSIS — R229 Localized swelling, mass and lump, unspecified: Secondary | ICD-10-CM | POA: Diagnosis not present

## 2022-09-13 DIAGNOSIS — E7849 Other hyperlipidemia: Secondary | ICD-10-CM

## 2022-09-13 DIAGNOSIS — I5042 Chronic combined systolic (congestive) and diastolic (congestive) heart failure: Secondary | ICD-10-CM | POA: Diagnosis not present

## 2022-09-13 DIAGNOSIS — I480 Paroxysmal atrial fibrillation: Secondary | ICD-10-CM | POA: Diagnosis not present

## 2022-09-13 DIAGNOSIS — R7303 Prediabetes: Secondary | ICD-10-CM

## 2022-09-13 DIAGNOSIS — K219 Gastro-esophageal reflux disease without esophagitis: Secondary | ICD-10-CM | POA: Diagnosis not present

## 2022-09-13 DIAGNOSIS — G5792 Unspecified mononeuropathy of left lower limb: Secondary | ICD-10-CM | POA: Diagnosis not present

## 2022-09-13 DIAGNOSIS — I10 Essential (primary) hypertension: Secondary | ICD-10-CM | POA: Diagnosis not present

## 2022-09-13 MED ORDER — DEXLANSOPRAZOLE 60 MG PO CPDR: 60.0000 mg | DELAYED_RELEASE_CAPSULE | Freq: Every day | ORAL | 6 refills | Status: DC

## 2022-09-13 NOTE — Progress Notes (Signed)
   Subjective:    Patient ID: Jeremy Goodman, male    DOB: 10/11/1980, 42 y.o.   MRN: [id number]  HPI Patient arrives today for 6 mouth follow up hypertension, and diabetes. Patient request Dexilant for acid reflux,   Patient is having numbness in left ankle. .Prediabetes - Plan: Hemoglobin A1c  Primary hypertension - Plan: Basic metabolic panel, Microalbumin/Creatinine Ratio, Urine  Morbid obesity (HCC)  Other hyperlipidemia - Plan: Lipid panel, Hepatic function panel  Class 1 congestive heart failure, chronic, combined ([place]) - Plan: Brain natriuretic peptide  Skin nodule - Plan: Ambulatory referral to Dermatology  Gastroesophageal reflux disease without esophagitis  Paroxysmal atrial fibrillation (HCC)  Neuropathy of left foot  Patient relates neuropathy of the left foot has been off and on over the past several weeks no weakness with it. He does relate that he gets a little bit out of breath when he walks up a hill or does any type of vigorous activity but denies substernal chest pressure Relates he had a small purple skin nodule come up on the left calf that the [place] doctors told him to see a dermatologist It is not Bleeding He is stating that he is having a hard time eating healthy because of things being so busy and having to take care of his mother He has gained weight does a little bit of walking but not much  Review of Systems     Objective:   Physical Exam General-in no acute distress Eyes-no discharge Lungs-respiratory rate normal, CTA CV-no murmurs Extremities skin warm dry no edema Neuro grossly normal Behavior normal, alert        Assessment & Plan:  1. Prediabetes Watch diet closely try to improve diet try to improve activity try to bring weight down - Hemoglobin A1c  2. Primary hypertension Blood pressure decent control continue current medication check labs - Basic metabolic panel - Microalbumin/Creatinine Ratio, Urine  3. Morbid obesity  ([place]) Portion control regular physical activity try to bring weight down for overall health  4. Other hyperlipidemia Check cholesterol profile await results - Lipid panel - Hepatic function panel  5. Class 1 congestive heart failure, chronic, combined ([place]) Patient does get out of breath while walking up hills but otherwise is doing well no overt sign of CHF going on currently continue current medication check labs - Brain natriuretic peptide  6. Skin nodule Has a small purple nodule on the left calf needs to be seen by dermatology go ahead with referral - Ambulatory referral to Dermatology  7. Gastroesophageal reflux disease without esophagitis Patient states pantoprazole not doing as well as Dexilant is requesting switching medicines if he does not improve with this he is to let us know  8. Paroxysmal atrial fibrillation (HCC) Tolerating Eliquis not having any bleeding issues currently.  He does see his [facility] doctors on a regular basis he will follow-up with [place] every 6 months

## 2022-09-15 LAB — BRAIN NATRIURETIC PEPTIDE: BNP: 32.5 pg/mL (ref 0.0–100.0)

## 2022-09-15 LAB — LIPID PANEL
Chol/HDL Ratio: 3.9 ratio (ref 0.0–5.0)
Cholesterol, Total: 163 mg/dL (ref 100–199)
HDL: 42 mg/dL (ref >39)
LDL Chol Calc (NIH): 108 mg/dL — ABNORMAL HIGH (ref 0–99)
Triglycerides: 65 mg/dL (ref 0–149)
VLDL Cholesterol Cal: 13 mg/dL (ref 5–40)

## 2022-09-15 LAB — BASIC METABOLIC PANEL
BUN/Creatinine Ratio: 18 (ref 9–20)
BUN: 16 mg/dL (ref 6–20)
CO2: 22 mmol/L (ref 20–29)
Calcium: 9.6 mg/dL (ref 8.7–10.2)
Chloride: 99 mmol/L (ref 96–106)
Creatinine, Ser: 0.87 mg/dL (ref 0.76–1.27)
Glucose: 92 mg/dL (ref 70–99)
Potassium: 4.6 mmol/L (ref 3.5–5.2)
Sodium: 137 mmol/L (ref 134–144)
eGFR: 113 mL/min/{1.73_m2} (ref >59)

## 2022-09-15 LAB — MICROALBUMIN / CREATININE URINE RATIO
Creatinine, Urine: 195.9 mg/dL
Microalb/Creat Ratio: 10 mg/g creat (ref 0–29)
Microalbumin, Urine: 18.9 ug/mL

## 2022-09-15 LAB — HEPATIC FUNCTION PANEL
ALT: 17 IU/L (ref 0–44)
AST: 20 IU/L (ref 0–40)
Albumin: 4.6 g/dL (ref 4.1–5.1)
Alkaline Phosphatase: 95 IU/L (ref 44–121)
Bilirubin Total: 0.9 mg/dL (ref 0.0–1.2)
Bilirubin, Direct: 0.26 mg/dL (ref 0.00–0.40)
Total Protein: 8 g/dL (ref 6.0–8.5)

## 2022-09-15 LAB — HEMOGLOBIN A1C
Est. average glucose Bld gHb Est-mCnc: 111 mg/dL
Hgb A1c MFr Bld: 5.5 % (ref 4.8–5.6)

## 2022-09-26 DIAGNOSIS — Z95810 Presence of automatic (implantable) cardiac defibrillator: Secondary | ICD-10-CM | POA: Diagnosis not present

## 2022-10-03 DIAGNOSIS — Z95810 Presence of automatic (implantable) cardiac defibrillator: Secondary | ICD-10-CM | POA: Diagnosis not present

## 2022-10-18 ENCOUNTER — Encounter: Payer: Self-pay | Admitting: Family Medicine

## 2022-10-19 ENCOUNTER — Telehealth: Payer: Self-pay | Admitting: Family Medicine

## 2022-10-19 DIAGNOSIS — K219 Gastro-esophageal reflux disease without esophagitis: Secondary | ICD-10-CM | POA: Insufficient documentation

## 2022-10-19 NOTE — Telephone Encounter (Signed)
Prior authorization form was filled out Please put in the ICD code for GERD Please also check what ever box needs to be checked on page 3 as well  Please be aware that this may or may not get approved often insurance companies review all PPIs the same

## 2022-10-19 NOTE — Telephone Encounter (Signed)
This was filled out  It is hard to know if insurance company will or will not approve it

## 2022-10-26 NOTE — Telephone Encounter (Signed)
Form faxed and waiting determination- Patient is aware

## 2022-11-30 DIAGNOSIS — I8393 Asymptomatic varicose veins of bilateral lower extremities: Secondary | ICD-10-CM | POA: Diagnosis not present

## 2022-11-30 DIAGNOSIS — Z9889 Other specified postprocedural states: Secondary | ICD-10-CM | POA: Diagnosis not present

## 2022-11-30 DIAGNOSIS — Q249 Congenital malformation of heart, unspecified: Secondary | ICD-10-CM | POA: Diagnosis not present

## 2022-11-30 DIAGNOSIS — Z8774 Personal history of (corrected) congenital malformations of heart and circulatory system: Secondary | ICD-10-CM | POA: Diagnosis not present

## 2022-11-30 DIAGNOSIS — Z95810 Presence of automatic (implantable) cardiac defibrillator: Secondary | ICD-10-CM | POA: Diagnosis not present

## 2022-11-30 DIAGNOSIS — I5022 Chronic systolic (congestive) heart failure: Secondary | ICD-10-CM | POA: Diagnosis not present

## 2022-11-30 DIAGNOSIS — I5020 Unspecified systolic (congestive) heart failure: Secondary | ICD-10-CM | POA: Diagnosis not present

## 2022-11-30 DIAGNOSIS — Q203 Discordant ventriculoarterial connection: Secondary | ICD-10-CM | POA: Diagnosis not present

## 2022-12-04 ENCOUNTER — Encounter: Payer: Self-pay | Admitting: Family Medicine

## 2022-12-04 NOTE — Progress Notes (Signed)
Please mail to the patient 

## 2022-12-26 DIAGNOSIS — Z95810 Presence of automatic (implantable) cardiac defibrillator: Secondary | ICD-10-CM | POA: Diagnosis not present

## 2023-01-06 DIAGNOSIS — Z95810 Presence of automatic (implantable) cardiac defibrillator: Secondary | ICD-10-CM | POA: Diagnosis not present

## 2023-02-12 ENCOUNTER — Ambulatory Visit (INDEPENDENT_AMBULATORY_CARE_PROVIDER_SITE_OTHER): Payer: Medicare Other | Admitting: Family Medicine

## 2023-02-12 VITALS — BP 132/84 | HR 80 | Ht 71.0 in | Wt 313.4 lb

## 2023-02-12 DIAGNOSIS — R300 Dysuria: Secondary | ICD-10-CM | POA: Diagnosis not present

## 2023-02-12 LAB — POCT URINALYSIS DIP (CLINITEK)
Bilirubin, UA: NEGATIVE
Blood, UA: NEGATIVE
Glucose, UA: >=1000 mg/dL — AB
Ketones, POC UA: NEGATIVE mg/dL
Leukocytes, UA: NEGATIVE
Nitrite, UA: NEGATIVE
Spec Grav, UA: >=1.03 — AB (ref 1.010–1.025)
Urobilinogen, UA: 4 E.U./dL — AB
pH, UA: 6 (ref 5.0–8.0)

## 2023-02-12 MED ORDER — CEPHALEXIN 500 MG PO CAPS: 500.0000 mg | ORAL_CAPSULE | Freq: Two times a day (BID) | ORAL | 0 refills | Status: DC

## 2023-02-12 NOTE — Assessment & Plan Note (Signed)
UA not consistent with UTI. However, given symptoms I am placing him on antibiotic therapy while awaiting culture.

## 2023-02-12 NOTE — Patient Instructions (Signed)
We will call with culture results.  Take antibiotic as prescribed.  Take care  Dr. Adriana Simas

## 2023-02-12 NOTE — Progress Notes (Signed)
Subjective:  Patient ID: Jeremy Goodman, male    DOB: 1981/06/30  Age: 43 y.o. MRN: [id number]  CC: Chief Complaint  Patient presents with   Urinary Tract Infection    HPI:  42 year old male with an extensive PMH presents with urinary symptoms.   Had chills on Friday. No fever. Urinary symptoms started on Saturday - dysuria, frequency, cloudy, malodorous urine. Some low back pain. No abdominal pain. He has increased his fluid intake with improvement but not resolution. No concern for STD (denies sexual activity).   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   Pulmonary edema 09/03/2012   SVT (supraventricular tachycardia) 09/03/2012   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    Social Hx   Social History   Socioeconomic History   Marital status: Single    Spouse name: Not on file   Number of children: Not on file   Years of education: Not on file   Highest education level: 12th grade  Occupational History   Not on file  Tobacco Use   Smoking status: Never   Smokeless tobacco: Never  Substance and Sexual Activity   Alcohol use: No   Drug use: No   Sexual activity: Not Currently  Other Topics Concern   Not on file  Social History Narrative   Not on file   Social Determinants of Health   Financial Resource Strain: Low Risk  (02/12/2023)   Overall Financial Resource Strain (CARDIA)    Difficulty of Paying Living Expenses: Not very hard   Food Insecurity: No Food Insecurity (02/12/2023)   Hunger Vital Sign    Worried About Running Out of Food in the Last Year: Never true    Ran Out of Food in the Last Year: Never true  Transportation Needs: No Transportation Needs (02/12/2023)   PRAPARE - Administrator, Civil Service (Medical): No    Lack of Transportation (Non-Medical): No  Physical Activity: Insufficiently Active (02/12/2023)   Exercise Vital Sign    Days of Exercise per Week: 1 day    Minutes of Exercise per Session: 10 min  Stress: No Stress Concern Present (02/12/2023)   [employer] of Occupational Health - Occupational Stress Questionnaire    Feeling of Stress : Only a little  Social Connections: Moderately Integrated (02/12/2023)   Social Connection and Isolation Panel [NHANES]    Frequency of Communication with Friends and Family: More than three times a week    Frequency of Social Gatherings with Friends and Family: Once a week    Attends Religious Services: More than 4 times per year    Active Member of [employer] or Organizations: Yes    Attends Engineer, structural: More than 4 times per year    Marital Status: Never married    Review of Systems Per HPI  Objective:  BP 132/84   Pulse 80   Ht 5\' 11"  (1.803 m)   Wt (!) 313 lb 6.4 oz (142.2 kg)   SpO2 95%   BMI 43.71 kg/m  02/12/2023    3:58 PM 09/13/2022    8:45 AM 12/29/2021    9:05 AM  BP/Weight  Systolic BP 132 110 110  Diastolic BP 84 68 74  Wt. (Lbs) 313.4 321.4   BMI 43.71 kg/m2 44.83 kg/m2     Physical Exam Vitals and nursing note reviewed.  Constitutional:      General: He is not in acute distress.    Appearance: Normal appearance. He is obese.  HENT:     Head: Normocephalic and atraumatic.  Eyes:     General:        Right eye: No discharge.        Left eye: No discharge.     Conjunctiva/sclera: Conjunctivae normal.  Cardiovascular:     Rate and Rhythm: Normal rate and regular rhythm.     Heart sounds:  Murmur heard.  Pulmonary:     Effort: Pulmonary effort is normal.     Breath sounds: Normal breath sounds. No wheezing, rhonchi or rales.  Neurological:     Mental Status: He is alert.     Lab Results  Component Value Date   WBC 8.6 12/13/2021   HGB 16.3 12/13/2021   HCT 48.8 12/13/2021   PLT 227 12/13/2021   GLUCOSE 92 09/13/2022   CHOL 163 09/13/2022   TRIG 65 09/13/2022   HDL 42 09/13/2022   LDLCALC 108 (H) 09/13/2022   ALT 17 09/13/2022   AST 20 09/13/2022   NA 137 09/13/2022   K 4.6 09/13/2022   CL 99 09/13/2022   CREATININE 0.87 09/13/2022   BUN 16 09/13/2022   CO2 22 09/13/2022   HGBA1C 5.5 09/13/2022     Assessment & Plan:   Problem List Items Addressed This Visit       Other   Dysuria - Primary    UA not consistent with UTI. However, given symptoms I am placing him on antibiotic therapy while awaiting culture.       Relevant Orders   POCT URINALYSIS DIP (CLINITEK) (Completed)   Urine Culture    Meds ordered this encounter  Medications   cephALEXin (KEFLEX) 500 MG capsule    Sig: Take 1 capsule (500 mg total) by mouth 2 (two) times daily.    Dispense:  14 capsule    Refill:  0    Follow-up:  Return if symptoms worsen or fail to improve.  Everlene Other DO [facility]

## 2023-02-14 LAB — SPECIMEN STATUS REPORT

## 2023-02-14 LAB — URINE CULTURE

## 2023-02-26 ENCOUNTER — Other Ambulatory Visit: Payer: Self-pay | Admitting: Family Medicine

## 2023-02-28 ENCOUNTER — Ambulatory Visit (HOSPITAL_COMMUNITY)
Admission: RE | Admit: 2023-02-28 | Discharge: 2023-02-28 | Disposition: A | Discharge status: Home / Self Care | Payer: Medicare Other | Source: Ambulatory Visit | Attending: Family Medicine | Admitting: Family Medicine

## 2023-02-28 ENCOUNTER — Ambulatory Visit (INDEPENDENT_AMBULATORY_CARE_PROVIDER_SITE_OTHER): Payer: Medicare Other | Admitting: Family Medicine

## 2023-02-28 VITALS — BP 118/80 | HR 77 | Temp 97.7°F | Ht 71.0 in | Wt 306.8 lb

## 2023-02-28 DIAGNOSIS — E7849 Other hyperlipidemia: Secondary | ICD-10-CM

## 2023-02-28 DIAGNOSIS — K219 Gastro-esophageal reflux disease without esophagitis: Secondary | ICD-10-CM | POA: Diagnosis not present

## 2023-02-28 DIAGNOSIS — M7732 Calcaneal spur, left foot: Secondary | ICD-10-CM | POA: Diagnosis not present

## 2023-02-28 DIAGNOSIS — R7303 Prediabetes: Secondary | ICD-10-CM

## 2023-02-28 DIAGNOSIS — S99922A Unspecified injury of left foot, initial encounter: Secondary | ICD-10-CM | POA: Diagnosis not present

## 2023-02-28 DIAGNOSIS — I10 Essential (primary) hypertension: Secondary | ICD-10-CM | POA: Diagnosis not present

## 2023-02-28 DIAGNOSIS — S9032XA Contusion of left foot, initial encounter: Secondary | ICD-10-CM | POA: Diagnosis not present

## 2023-02-28 DIAGNOSIS — M79672 Pain in left foot: Secondary | ICD-10-CM | POA: Diagnosis not present

## 2023-02-28 DIAGNOSIS — S8002XA Contusion of left knee, initial encounter: Secondary | ICD-10-CM | POA: Diagnosis not present

## 2023-02-28 DIAGNOSIS — R600 Localized edema: Secondary | ICD-10-CM | POA: Diagnosis not present

## 2023-02-28 DIAGNOSIS — M25562 Pain in left knee: Secondary | ICD-10-CM | POA: Diagnosis not present

## 2023-02-28 MED ORDER — LISINOPRIL 2.5 MG PO TABS: 2.5000 mg | ORAL_TABLET | Freq: Every day | ORAL | 1 refills | Status: DC

## 2023-02-28 MED ORDER — METOPROLOL SUCCINATE ER 100 MG PO TB24: ORAL_TABLET | ORAL | 1 refills | Status: DC

## 2023-02-28 MED ORDER — APIXABAN 5 MG PO TABS: 5.0000 mg | ORAL_TABLET | Freq: Two times a day (BID) | ORAL | 1 refills | Status: DC

## 2023-02-28 MED ORDER — SPIRONOLACTONE 25 MG PO TABS: 25.0000 mg | ORAL_TABLET | Freq: Every day | ORAL | 1 refills | Status: DC

## 2023-02-28 MED ORDER — PANTOPRAZOLE SODIUM 40 MG PO TBEC: 40.0000 mg | DELAYED_RELEASE_TABLET | Freq: Every day | ORAL | 1 refills | Status: DC

## 2023-02-28 MED ORDER — FUROSEMIDE 20 MG PO TABS: 20.0000 mg | ORAL_TABLET | Freq: Two times a day (BID) | ORAL | 1 refills | Status: DC

## 2023-02-28 NOTE — Progress Notes (Signed)
   Subjective:    Patient ID: Jeremy Goodman, male    DOB: 22-Oct-1980, 42 y.o.   MRN: [id number]  HPI    Review of Systems     Objective:   Physical Exam        Assessment & Plan:

## 2023-02-28 NOTE — Progress Notes (Signed)
   Subjective:    Patient ID: Jeremy Goodman, male    DOB: [birth year], 42 y.o.   MRN: [id number]  HPI Pt fell on 02/16/2023 and states his left knee down to his ankle is still swollen, pain is worse when lying down then getting up with tighness in the knee cap.   Review of Systems     Objective:   Physical Exam  General-in no acute distress Eyes-no discharge Lungs-respiratory rate normal, CTA CV-no murmurs,RRR Extremities skin warm dry no edema Neuro grossly normal Behavior normal, alert Calf is not tender there is some swelling associated with his knees stiffness in the knee as well as tenderness in the foot no tenderness in the ankle  Patient on blood thinner I do not feel he has a DVT    Assessment & Plan:  X-rays ordered X-ray showed possible avulsion fracture in the foot more than likely this is old will go ahead with orthopedic referral Knee swelling from contusion x-ray negative referral to orthopedics Lab work ordered patient will do these labs before his follow-up visit in a few weeks

## 2023-03-07 ENCOUNTER — Encounter: Payer: Self-pay | Admitting: Orthopedic Surgery

## 2023-03-07 ENCOUNTER — Ambulatory Visit: Payer: Medicare Other | Admitting: Orthopedic Surgery

## 2023-03-07 VITALS — BP 121/73 | HR 76 | Ht 70.0 in | Wt 310.5 lb

## 2023-03-07 DIAGNOSIS — R7303 Prediabetes: Secondary | ICD-10-CM | POA: Diagnosis not present

## 2023-03-07 DIAGNOSIS — S8002XA Contusion of left knee, initial encounter: Secondary | ICD-10-CM | POA: Diagnosis not present

## 2023-03-07 DIAGNOSIS — E7849 Other hyperlipidemia: Secondary | ICD-10-CM | POA: Diagnosis not present

## 2023-03-07 DIAGNOSIS — I10 Essential (primary) hypertension: Secondary | ICD-10-CM | POA: Diagnosis not present

## 2023-03-07 DIAGNOSIS — S92252A Displaced fracture of navicular [scaphoid] of left foot, initial encounter for closed fracture: Secondary | ICD-10-CM | POA: Diagnosis not present

## 2023-03-07 NOTE — Patient Instructions (Signed)
Instructions  1.  You have sustained an ankle sprain, or similar exercises that can be treated as an ankle sprain.  **These exercises can also be used as part of recovery from an ankle fracture.  2.  I encourage you to stay on your feet and gradually remove your walking boot.   3.  Below are some exercises that you can complete on your own to improve your symptoms.  4.  As an alternative, you can search for ankle sprain exercises online, and can see some demonstrations on YouTube  5.  If you are having difficulty with these exercises, we can also prescribe formal physical therapy  Ankle Exercises Ask your health care provider which exercises are safe for you. Do exercises exactly as told by your health care provider and adjust them as directed. It is normal to feel mild stretching, pulling, tightness, or mild discomfort as you do these exercises. Stop right away if you feel sudden pain or your pain gets worse. Do not begin these exercises until told by your health care provider.  Stretching and range-of-motion exercises These exercises warm up your muscles and joints and improve the movement and flexibility of your ankle. These exercises may also help to relieve pain.  Dorsiflexion/plantar flexion  Sit with your L knee straight or bent. Do not rest your foot on anything. Flex your left ankle to tilt the top of your foot toward your shin. This is called dorsiflexion. Hold this position for 5 seconds. Point your toes downward to tilt the top of your foot away from your shin. This is called plantar flexion. Hold this position for 5 seconds. Repeat 10 times. Complete this exercise 2-3 times a day.  As tolerated  Ankle alphabet  Sit with your L foot supported at your lower leg. Do not rest your foot on anything. Make sure your foot has room to move freely. Think of your L foot as a paintbrush: Move your foot to trace each letter of the alphabet in the air. Keep your hip and knee still while  you trace the letters. Trace every letter from A to Z. Make the letters as large as you can without causing or increasing any discomfort.  Repeat 2-3 times. Complete this exercise 2-3 times a day.   Strengthening exercises These exercises build strength and endurance in your ankle. Endurance is the ability to use your muscles for a long time, even after they get tired. Dorsiflexors These are muscles that lift your foot up. Secure a rubber exercise band or tube to an object, such as a table leg, that will stay still when the band is pulled. Secure the other end around your L foot. Sit on the floor, facing the object with your L leg extended. The band or tube should be slightly tense when your foot is relaxed. Slowly flex your L ankle and toes to bring your foot toward your shin. Hold this position for 5 seconds. Slowly return your foot to the starting position, controlling the band as you do that. Repeat 10 times. Complete this exercise 2-3 times a day.  Plantar flexors These are muscles that push your foot down. Sit on the floor with your L leg extended. Loop a rubber exercise band or tube around the ball of your L foot. The ball of your foot is on the walking surface, right under your toes. The band or tube should be slightly tense when your foot is relaxed. Slowly point your toes downward, pushing them away from  you. Hold this position for 5 seconds. Slowly release the tension in the band or tube, controlling smoothly until your foot is back in the starting position. Repeat 10 times. Complete this exercise 2-3 times a day.  Towel curls  Sit in a chair on a non-carpeted surface, and put your feet on the floor. Place a towel in front of your feet. Keeping your heel on the floor, put your L foot on the towel. Pull the towel toward you by grabbing the towel with your toes and curling them under. Keep your heel on the floor. Let your toes relax. Grab the towel again. Keep pulling the  towel until it is completely underneath your foot. Repeat 10 times. Complete this exercise 2-3 times a day.  Standing plantar flexion This is an exercise in which you use your toes to lift your body's weight while standing. Stand with your feet shoulder-width apart. Keep your weight spread evenly over the width of your feet while you rise up on your toes. Use a wall or table to steady yourself if needed, but try not to use it for support. If this exercise is too easy, try these options: Shift your weight toward your L leg until you feel challenged. If told by your health care provider, lift your uninjured leg off the floor. Hold this position for 5 seconds. Repeat 10 times. Complete this exercise 2-3 times a day.  Tandem walking Stand with one foot directly in front of the other. Slowly raise your back foot up, lifting your heel before your toes, and place it directly in front of your other foot. Continue to walk in this heel-to-toe way. Have a countertop or wall nearby to use if needed to keep your balance, but try not to hold onto anything for support.  Repeat 10 times. Complete this exercise 2-3 times a day.     Knee Exercises  Ask your health care provider which exercises are safe for you. Do exercises exactly as told by your health care provider and adjust them as directed. It is normal to feel mild stretching, pulling, tightness, or discomfort as you do these exercises. Stop right away if you feel sudden pain or your pain gets worse. Do not begin these exercises until told by your health care provider.  Stretching and range-of-motion exercises These exercises warm up your muscles and joints and improve the movement and flexibility of your knee. These exercises also help to relieve pain and swelling.  Knee extension, prone Lie on your abdomen (prone position) on a bed. Place your left / right knee just beyond the edge of the surface so your knee is not on the bed. You can put a  towel under your left / right thigh just above your kneecap for comfort. Relax your leg muscles and allow gravity to straighten your knee (extension). You should feel a stretch behind your left / right knee. Hold this position for 10 seconds. Scoot up so your knee is supported between repetitions. Repeat 10 times. Complete this exercise 3-4 times per week.     Knee flexion, active Lie on your back with both legs straight. If this causes back discomfort, bend your left / right knee so your foot is flat on the floor. Slowly slide your left / right heel back toward your buttocks. Stop when you feel a gentle stretch in the front of your knee or thigh (flexion). Hold this position for 10 seconds. Slowly slide your left / right heel back to the  starting position. Repeat 10 times. Complete this exercise 3-4 times per week.      Quadriceps stretch, prone Lie on your abdomen on a firm surface, such as a bed or padded floor. Bend your left / right knee and hold your ankle. If you cannot reach your ankle or pant leg, loop a belt around your foot and grab the belt instead. Gently pull your heel toward your buttocks. Your knee should not slide out to the side. You should feel a stretch in the front of your thigh and knee (quadriceps). Hold this position for 10 seconds. Repeat 10 times. Complete this exercise 3-4 times per week.      Hamstring, supine Lie on your back (supine position). Loop a belt or towel over the ball of your left / right foot. The ball of your foot is on the walking surface, right under your toes. Straighten your left / right knee and slowly pull on the belt to raise your leg until you feel a gentle stretch behind your knee (hamstring). Do not let your knee bend while you do this. Keep your other leg flat on the floor. Hold this position for 10 seconds. Repeat 10 times. Complete this exercise 3-4 times per week.   Strengthening exercises These exercises build strength and  endurance in your knee. Endurance is the ability to use your muscles for a long time, even after they get tired.  Quadriceps, isometric This exercise stretches the muscles in front of your thigh (quadriceps) without moving your knee joint (isometric). Lie on your back with your left / right leg extended and your other knee bent. Put a rolled towel or small pillow under your knee if told by your health care provider. Slowly tense the muscles in the front of your left / right thigh. You should see your kneecap slide up toward your hip or see increased dimpling just above the knee. This motion will push the back of the knee toward the floor. For 10 seconds, hold the muscle as tight as you can without increasing your pain. Relax the muscles slowly and completely. Repeat 10 times. Complete this exercise 3-4 times per week. .     Straight leg raises This exercise stretches the muscles in front of your thigh (quadriceps) and the muscles that move your hips (hip flexors). Lie on your back with your left / right leg extended and your other knee bent. Tense the muscles in the front of your left / right thigh. You should see your kneecap slide up or see increased dimpling just above the knee. Your thigh may even shake a bit. Keep these muscles tight as you raise your leg 4-6 inches (10-15 cm) off the floor. Do not let your knee bend. Hold this position for 10 seconds. Keep these muscles tense as you lower your leg. Relax your muscles slowly and completely after each repetition. Repeat 10 times. Complete this exercise 3-4 times per week.  Hamstring, isometric Lie on your back on a firm surface. Bend your left / right knee about 30 degrees. Dig your left / right heel into the surface as if you are trying to pull it toward your buttocks. Tighten the muscles in the back of your thighs (hamstring) to "dig" as hard as you can without increasing any pain. Hold this position for 10 seconds. Release the  tension gradually and allow your muscles to relax completely for __________ seconds after each repetition. Repeat 10 times. Complete this exercise 3-4 times per week.  Hamstring curls If told by your health care provider, do this exercise while wearing ankle weights. Begin with 5 lb weights. Then increase the weight by 1 lb (0.5 kg) increments. You can also use an exercise band Lie on your abdomen with your legs straight. Bend your left / right knee as far as you can without feeling pain. Keep your hips flat against the floor. Hold this position for 10 seconds. Slowly lower your leg to the starting position. Repeat 10 times. Complete this exercise 3-4 times per week.      Squats This exercise strengthens the muscles in front of your thigh and knee (quadriceps). Stand in front of a table, with your feet and knees pointing straight ahead. You may rest your hands on the table for balance but not for support. Slowly bend your knees and lower your hips like you are going to sit in a chair. Keep your weight over your heels, not over your toes. Keep your lower legs upright so they are parallel with the table legs. Do not let your hips go lower than your knees. Do not bend lower than told by your health care provider. If your knee pain increases, do not bend as low. Hold the squat position for 10 seconds. Slowly push with your legs to return to standing. Do not use your hands to pull yourself to standing. Repeat 10 times. Complete this exercise 3-4 times per week .     Wall slides This exercise strengthens the muscles in front of your thigh and knee (quadriceps). Lean your back against a smooth wall or door, and walk your feet out 18-24 inches (46-61 cm) from it. Place your feet hip-width apart. Slowly slide down the wall or door until your knees bend 90 degrees. Keep your knees over your heels, not over your toes. Keep your knees in line with your hips. Hold this position for 10  seconds. Repeat 10 times. Complete this exercise 3-4 times per week.      Straight leg raises This exercise strengthens the muscles that rotate the leg at the hip and move it away from your body (hip abductors). Lie on your side with your left / right leg in the top position. Lie so your head, shoulder, knee, and hip line up. You may bend your bottom knee to help you keep your balance. Roll your hips slightly forward so your hips are stacked directly over each other and your left / right knee is facing forward. Leading with your heel, lift your top leg 4-6 inches (10-15 cm). You should feel the muscles in your outer hip lifting. Do not let your foot drift forward. Do not let your knee roll toward the ceiling. Hold this position for 10 seconds. Slowly return your leg to the starting position. Let your muscles relax completely after each repetition. Repeat 10 times. Complete this exercise 3-4 times per week.      Straight leg raises This exercise stretches the muscles that move your hips away from the front of the pelvis (hip extensors). Lie on your abdomen on a firm surface. You can put a pillow under your hips if that is more comfortable. Tense the muscles in your buttocks and lift your left / right leg about 4-6 inches (10-15 cm). Keep your knee straight as you lift your leg. Hold this position for 10 seconds. Slowly lower your leg to the starting position. Let your leg relax completely after each repetition. Repeat 10 times. Complete this exercise 3-4 times  per week.

## 2023-03-07 NOTE — Progress Notes (Signed)
New Patient Visit  Assessment: Jeremy Goodman is a 42 y.o. male with the following: 1. Closed avulsion fracture of navicular bone of left foot, initial encounter 2. Contusion of left knee, initial encounter   Plan: Martise Waddell has multiple fractures to the navicular of the left foot.  He has some associated bruising and swelling.  He has also likely sustained a sprained ankle.  He has not some bruising and swelling around the left knee.  Bruising is localized to the anterior knee.  This is already started to improve.  Anticipate that this will continue to improve.  Otherwise, provided reassurance.  Medicines as needed.  Elevate the foot to help with swelling.  Simple exercises have been provided.  He is comfortable proceeding with the plan, and will let me know if he has further issues.  No follow-up is needed at this time.  Follow-up: Return if symptoms worsen or fail to improve.  Subjective:  Chief Complaint  Patient presents with   Knee Pain    DOI 02/16/23/ Larey Seat outside a restaurant and landed on my left knee hard. Went to pcp and was sent to have xrays done. Has a lot of swelling and painful.    History of Present Illness: Keigan Tafoya is a 42 y.o. male who has been referred by Johnnye Lana, MD for evaluation of left knee and ankle pain.  Approximate 3 weeks ago, he fell, landing on the left knee and twisting his left ankle.  He continues to have residual swelling and pain.  The pain overall is improving.  He notes increased swelling and pain with increased activities.  No therapies.  He has not been wearing a boot.   Review of Systems: No fevers or chills No numbness or tingling No chest pain No shortness of breath No bowel or bladder dysfunction No GI distress No headaches   Medical History:  Past Medical History:  Diagnosis Date   Anginal pain (HCC)    C. difficile diarrhea    2 weeks ago prior to admission 1/20 tx'ed with Flagyl    CHF (congestive heart failure) (HCC)     HTN (hypertension) 08/21/2014   Transposition of great vessel     Past Surgical History:  Procedure Laterality Date   CORONARY ARTERY BYPASS GRAFT     IMPLANTABLE CARDIOVERTER DEFIBRILLATOR IMPLANT      Family History  Problem Relation Age of Onset   Atrial fibrillation Father    Other Father        s/p pacemaker    Social History   Tobacco Use   Smoking status: Never   Smokeless tobacco: Never  Substance Use Topics   Alcohol use: No   Drug use: No    Allergies  Allergen Reactions   Levaquin [Levofloxacin In D5w] Other (See Comments)    Hallucinations, and the medication affects his muscles.    Current Meds  Medication Sig   apixaban (ELIQUIS) 5 MG TABS tablet Take 1 tablet (5 mg total) by mouth 2 (two) times daily.   cephALEXin (KEFLEX) 500 MG capsule Take 1 capsule (500 mg total) by mouth 2 (two) times daily.   Dextromethorphan-Guaifenesin (MUCINEX DM MAXIMUM STRENGTH) 60-1200 MG TB12 Take by mouth as needed. Reported on 11/25/2015   diphenhydrAMINE (SOMINEX) 25 MG tablet Take 25 mg by mouth at bedtime as needed for sleep. Reported on 11/25/2015   empagliflozin (JARDIANCE) 25 MG TABS tablet    furosemide (LASIX) 20 MG tablet Take 1 tablet (20 mg total) by mouth  2 (two) times daily.   lisinopril (ZESTRIL) 2.5 MG tablet Take 1 tablet (2.5 mg total) by mouth daily.   meclizine (ANTIVERT) 25 MG tablet Take 1 tablet (25 mg total) by mouth 3 (three) times daily as needed for dizziness.   metoprolol succinate (TOPROL-XL) 100 MG 24 hr tablet TAKE 1 TABLET BY MOUTH ONCE DAILY WITH A MEAL.   nystatin cream (MYCOSTATIN) Apply 1 application. topically 2 (two) times daily.   ondansetron (ZOFRAN-ODT) 4 MG disintegrating tablet Take 1 tablet (4 mg total) by mouth every 8 (eight) hours as needed for nausea.   pantoprazole (PROTONIX) 40 MG tablet Take 1 tablet (40 mg total) by mouth daily.   simethicone (MYLICON) 125 MG chewable tablet 125 mg. Take 125 mg by mouth every 6 (six) hours as  needed.   spironolactone (ALDACTONE) 25 MG tablet Take 1 tablet (25 mg total) by mouth daily.    Objective: BP 121/73   Pulse 76   Ht 5\' 10"  (1.778 m)   Wt (!) 310 lb 8 oz (140.8 kg)   BMI 44.55 kg/m   Physical Exam:  General: Alert and oriented. and No acute distress. Gait: Left sided antalgic gait.  Left knee with swelling over the anterior knee.  Consistent with bursitis.  General bruising and swelling throughout the left lower leg.  Tenderness palpation of the anterior and lateral aspect of the left ankle.  He has good range of motion of the left foot.  Varicosities throughout the lower extremities.  Sensation is intact to the dorsum of the left foot.  Active motion intact in the TA/EHL.  No tenderness palpation over the medial lateral joint line.  He has good range of motion of the knee, well beyond 90 degrees.  He is able to achieve full extension.  Negative Lachman.  IMAGING: I personally reviewed images previously obtained in clinic   X-rays left foot of the left knee were obtained prior to clinic today.  No injuries within the left knee.  Within the left foot, there are some small avulsion fractures off the dorsal aspect of the navicular.  Mortise is congruent.  No syndesmotic disruption.   New Medications:  No orders of the defined types were placed in this encounter.     Oliver Barre, MD  03/07/2023 10:22 AM

## 2023-03-08 LAB — BASIC METABOLIC PANEL
BUN/Creatinine Ratio: 21 — ABNORMAL HIGH (ref 9–20)
BUN: 16 mg/dL (ref 6–24)
CO2: 23 mmol/L (ref 20–29)
Calcium: 9.8 mg/dL (ref 8.7–10.2)
Chloride: 102 mmol/L (ref 96–106)
Glucose: 86 mg/dL (ref 70–99)
Potassium: 4.2 mmol/L (ref 3.5–5.2)
Sodium: 139 mmol/L (ref 134–144)
eGFR: 117 mL/min/{1.73_m2} (ref >59)

## 2023-03-08 LAB — LIPID PANEL
Chol/HDL Ratio: 3.5 ratio (ref 0.0–5.0)
Cholesterol, Total: 156 mg/dL (ref 100–199)
HDL: 45 mg/dL (ref >39)
LDL Chol Calc (NIH): 98 mg/dL (ref 0–99)
Triglycerides: 66 mg/dL (ref 0–149)
VLDL Cholesterol Cal: 13 mg/dL (ref 5–40)

## 2023-03-08 LAB — HEMOGLOBIN A1C
Est. average glucose Bld gHb Est-mCnc: 114 mg/dL
Hgb A1c MFr Bld: 5.6 % (ref 4.8–5.6)

## 2023-03-08 LAB — HEPATIC FUNCTION PANEL
ALT: 20 IU/L (ref 0–44)
AST: 21 IU/L (ref 0–40)
Albumin: 4.4 g/dL (ref 4.1–5.1)
Alkaline Phosphatase: 91 IU/L (ref 44–121)
Bilirubin Total: 0.9 mg/dL (ref 0.0–1.2)
Bilirubin, Direct: 0.28 mg/dL (ref 0.00–0.40)

## 2023-03-14 ENCOUNTER — Ambulatory Visit (INDEPENDENT_AMBULATORY_CARE_PROVIDER_SITE_OTHER): Payer: Medicare Other | Admitting: Family Medicine

## 2023-03-14 VITALS — BP 100/66 | HR 72 | Temp 97.2°F | Ht 70.0 in | Wt 309.0 lb

## 2023-03-14 DIAGNOSIS — E7849 Other hyperlipidemia: Secondary | ICD-10-CM | POA: Diagnosis not present

## 2023-03-14 DIAGNOSIS — R7303 Prediabetes: Secondary | ICD-10-CM | POA: Diagnosis not present

## 2023-03-14 DIAGNOSIS — I10 Essential (primary) hypertension: Secondary | ICD-10-CM | POA: Diagnosis not present

## 2023-03-14 DIAGNOSIS — H9311 Tinnitus, right ear: Secondary | ICD-10-CM | POA: Diagnosis not present

## 2023-03-14 MED ORDER — KETOCONAZOLE 2 % EX CREA: 1.0000 | TOPICAL_CREAM | Freq: Two times a day (BID) | CUTANEOUS | 4 refills | Status: DC

## 2023-03-14 NOTE — Progress Notes (Signed)
Subjective:    Patient ID: Jeremy Goodman, male    DOB: Sep 04, 1980, 42 y.o.   MRN: [id number]  HPI Patient is here for a 6 month follow up check  He is following up on left leg swelling from previous fall - fx in l foot and sprain ankle, also reports ringing in right ear he is deaf in L ear Recent labs done Diag Tinnitus of right ear  Other hyperlipidemia  Prediabetes  Primary hypertension  Morbid obesity (HCC) Patient with recent fall he still has ankle pain foot pain where he had avulsion fractures but he is getting over this  He has persistent swelling in the ankle that is related into that fall Patient for blood pressure check up.  The patient does have hypertension.   Patient relates dietary measures try to minimize salt The importance of healthy diet and activity were discussed Patient relates compliance  The patient's BMI is calculated.  The patient does have obesity.  The patient does try to some degree staying active and watching diet.  It is in the vital signs and acknowledged.  It is above the recommended BMI for the patient's height and weight.  The patient has been counseled regarding healthy diet, restricted portions, avoiding excessive carbohydrates/sugary foods, and increase physical activity as health permits.  It is in the patient's best interest to lower the risk of secondary illness including heart disease strokes and cancer by losing weight.  The patient acknowledges this information.   Review of Systems     Objective:   Physical Exam General-in no acute distress Eyes-no discharge Lungs-respiratory rate normal, CTA CV- murmurs,RRR Extremities skin warm dry no edema Neuro grossly normal Behavior normal, alert   Results for orders placed or performed in visit on 02/28/23  Basic Metabolic Panel  Result Value Ref Range   Glucose 86 70 - 99 mg/dL   BUN 16 6 - 24 mg/dL   Creatinine, Ser 0.63 (L) 0.76 - 1.27 mg/dL   eGFR 016 >01 UX/NAT/5.57    BUN/Creatinine Ratio 21 (H) 9 - 20   Sodium 139 134 - 144 mmol/L   Potassium 4.2 3.5 - 5.2 mmol/L   Chloride 102 96 - 106 mmol/L   CO2 23 20 - 29 mmol/L   Calcium 9.8 8.7 - 10.2 mg/dL  Lipid Panel  Result Value Ref Range   Cholesterol, Total 156 100 - 199 mg/dL   Triglycerides 66 0 - 149 mg/dL   HDL 45 >32 mg/dL   VLDL Cholesterol Cal 13 5 - 40 mg/dL   LDL Chol Calc (NIH) 98 0 - 99 mg/dL   Chol/HDL Ratio 3.5 0.0 - 5.0 ratio  Hepatic Function Panel  Result Value Ref Range   Total Protein 7.7 6.0 - 8.5 g/dL   Albumin 4.4 4.1 - 5.1 g/dL   Bilirubin Total 0.9 0.0 - 1.2 mg/dL   Bilirubin, Direct 2.02 0.00 - 0.40 mg/dL   Alkaline Phosphatase 91 44 - 121 IU/L   AST 21 0 - 40 IU/L   ALT 20 0 - 44 IU/L  Hemoglobin A1c  Result Value Ref Range   Hgb A1c MFr Bld 5.6 4.8 - 5.6 %   Est. average glucose Bld gHb Est-mCnc 114 mg/dL        Assessment & Plan:   1. Tinnitus of right ear Intermittent ringing of the right ear if it reoccurs again recommend audiology and ENT evaluation hearing protection discussed from loud noises  2. Other hyperlipidemia Cholesterol profile looks good  continue healthy diet  3. Prediabetes A1c stable portion control recommended regular activity  4. Primary hypertension Blood pressure good control currently stays on the low side with his congenital heart disease  Congenital heart disease follows with [facility] they are doing a stress test later this year  Morbid obesity healthy diet portion control regular physical activity  Follow-up within 6 months

## 2023-03-23 ENCOUNTER — Ambulatory Visit: Payer: Medicare Other | Admitting: Nurse Practitioner

## 2023-03-23 ENCOUNTER — Encounter: Payer: Self-pay | Admitting: Nurse Practitioner

## 2023-03-23 VITALS — BP 136/76 | HR 88 | Temp 98.5°F | Wt 311.6 lb

## 2023-03-23 DIAGNOSIS — R350 Frequency of micturition: Secondary | ICD-10-CM | POA: Diagnosis not present

## 2023-03-23 DIAGNOSIS — R300 Dysuria: Secondary | ICD-10-CM | POA: Diagnosis not present

## 2023-03-23 LAB — POCT URINALYSIS DIP (CLINITEK)
Glucose, UA: >=1000 mg/dL — AB
Ketones, POC UA: NEGATIVE mg/dL
Nitrite, UA: NEGATIVE
Spec Grav, UA: 1.02 (ref 1.010–1.025)
Urobilinogen, UA: 1 E.U./dL
pH, UA: 6 (ref 5.0–8.0)

## 2023-03-23 LAB — GLUCOSE, POCT (MANUAL RESULT ENTRY): POC Glucose: 85 mg/dl (ref 70–99)

## 2023-03-23 MED ORDER — CEPHALEXIN 500 MG PO CAPS: 500.0000 mg | ORAL_CAPSULE | Freq: Three times a day (TID) | ORAL | 0 refills | Status: DC

## 2023-03-23 NOTE — Progress Notes (Signed)
   Subjective:    Patient ID: Jeremy Goodman, male    DOB: September 03, 1980, 42 y.o.   MRN: [id number]  HPI Patient arrives for UTI. Patient states polyuria and dysuria. Patient states no concerns or issues today. Last seen for similar complaints on 02/12/2023.  His urine culture at that time was negative.  Symptoms resolved after taking Keflex.  Symptoms began again last night.  Describes urgency to the point he had slight leakage before he get to the bathroom.  Otherwise no incontinence.  Slight burning at the end of urination.  No fever.  Frequency.  No difficulty starting his stream.  No change in his urinary stream.  Noticed slight blood at the end of urination this morning.  Has a history of recurrent irritation and yeast infection since he is uncircumcised.  Has not applied the cream lately.  No current sexual activity, denies any new sexual partners.  His sugars were normal on his last labs but patient admits that lately he has been eating a lot more sugar such as candy and is increased his soda intake.  He is on 3 diuretic medications for his heart failure.   Review of Systems  Constitutional:  Negative for fever.  Respiratory:  Negative for cough, chest tightness and shortness of breath.   Cardiovascular:  Negative for chest pain.  Genitourinary:  Positive for dysuria, enuresis, frequency, hematuria and urgency. Negative for difficulty urinating, flank pain, genital sores and penile discharge.       Objective:   Physical Exam NAD.  Alert, oriented.  Lungs clear.  Heart regular rate rhythm.  No CVA tenderness to percussion.  Abdomen soft nondistended nontender. Results for orders placed or performed in visit on 03/23/23  POCT URINALYSIS DIP (CLINITEK)  Result Value Ref Range   Color, UA orange (A) yellow   Clarity, UA cloudy (A) clear   Glucose, UA >=1,000 (A) negative mg/dL   Bilirubin, UA small (A) negative   Ketones, POC UA negative negative mg/dL   Spec Grav, UA 1.914 7.829 - 1.025    Blood, UA large (A) negative   pH, UA 6.0 5.0 - 8.0   POC PROTEIN,UA trace negative, trace   Urobilinogen, UA 1.0 0.2 or 1.0 E.U./dL   Nitrite, UA Negative Negative   Leukocytes, UA Moderate (2+) (A) Negative  Glucose (CBG)  Result Value Ref Range   POC Glucose 85 70 - 99 mg/dl   Today's Vitals   56/21/30 1115  BP: 136/76  Pulse: 88  Temp: 98.5 F (36.9 C)  TempSrc: Oral  SpO2: 95%  Weight: (!) 311 lb 9.6 oz (141.3 kg)   Body mass index is 44.71 kg/m.         Assessment & Plan:  Dysuria - Plan: POCT URINALYSIS DIP (CLINITEK), Urine Culture  Urinary frequency - Plan: Glucose (CBG) Changes noted in the leukocyte and blood counts on his UA. Restart Keflex as directed.  Urine culture pending. Warning signs reviewed.  Patient to call office or seek help if worsening symptoms. Meds ordered this encounter  Medications   cephALEXin (KEFLEX) 500 MG capsule    Sig: Take 1 capsule (500 mg total) by mouth 3 (three) times daily.    Dispense:  21 capsule    Refill:  0    Order Specific Question:   Supervising Provider    Answer:   Lilyan Punt A [[id number]]   Otherwise recheck next week if no improvement.

## 2023-03-27 DIAGNOSIS — I517 Cardiomegaly: Secondary | ICD-10-CM | POA: Diagnosis not present

## 2023-03-27 DIAGNOSIS — I480 Paroxysmal atrial fibrillation: Secondary | ICD-10-CM | POA: Diagnosis not present

## 2023-03-27 DIAGNOSIS — Z8679 Personal history of other diseases of the circulatory system: Secondary | ICD-10-CM | POA: Diagnosis not present

## 2023-03-27 DIAGNOSIS — Z95810 Presence of automatic (implantable) cardiac defibrillator: Secondary | ICD-10-CM | POA: Diagnosis not present

## 2023-03-27 DIAGNOSIS — I4892 Unspecified atrial flutter: Secondary | ICD-10-CM | POA: Diagnosis not present

## 2023-03-27 DIAGNOSIS — Q203 Discordant ventriculoarterial connection: Secondary | ICD-10-CM | POA: Diagnosis not present

## 2023-03-27 DIAGNOSIS — Z9889 Other specified postprocedural states: Secondary | ICD-10-CM | POA: Diagnosis not present

## 2023-04-17 DIAGNOSIS — I5020 Unspecified systolic (congestive) heart failure: Secondary | ICD-10-CM | POA: Diagnosis not present

## 2023-04-17 DIAGNOSIS — Z8774 Personal history of (corrected) congenital malformations of heart and circulatory system: Secondary | ICD-10-CM | POA: Diagnosis not present

## 2023-04-17 DIAGNOSIS — I5042 Chronic combined systolic (congestive) and diastolic (congestive) heart failure: Secondary | ICD-10-CM | POA: Diagnosis not present

## 2023-04-17 DIAGNOSIS — Q203 Discordant ventriculoarterial connection: Secondary | ICD-10-CM | POA: Diagnosis not present

## 2023-04-17 DIAGNOSIS — Z9889 Other specified postprocedural states: Secondary | ICD-10-CM | POA: Diagnosis not present

## 2023-05-03 DIAGNOSIS — Z4502 Encounter for adjustment and management of automatic implantable cardiac defibrillator: Secondary | ICD-10-CM | POA: Diagnosis not present

## 2023-05-03 DIAGNOSIS — I4729 Other ventricular tachycardia: Secondary | ICD-10-CM | POA: Diagnosis not present

## 2023-05-03 DIAGNOSIS — I472 Ventricular tachycardia, unspecified: Secondary | ICD-10-CM | POA: Diagnosis not present

## 2023-05-18 ENCOUNTER — Ambulatory Visit: Payer: Medicare Other

## 2023-06-22 ENCOUNTER — Ambulatory Visit (INDEPENDENT_AMBULATORY_CARE_PROVIDER_SITE_OTHER): Payer: Medicare Other

## 2023-06-22 DIAGNOSIS — Z23 Encounter for immunization: Secondary | ICD-10-CM

## 2023-06-26 DIAGNOSIS — Z9889 Other specified postprocedural states: Secondary | ICD-10-CM | POA: Diagnosis not present

## 2023-06-26 DIAGNOSIS — Q203 Discordant ventriculoarterial connection: Secondary | ICD-10-CM | POA: Diagnosis not present

## 2023-06-26 DIAGNOSIS — I4892 Unspecified atrial flutter: Secondary | ICD-10-CM | POA: Diagnosis not present

## 2023-06-26 DIAGNOSIS — Z8679 Personal history of other diseases of the circulatory system: Secondary | ICD-10-CM | POA: Diagnosis not present

## 2023-06-26 DIAGNOSIS — I517 Cardiomegaly: Secondary | ICD-10-CM | POA: Diagnosis not present

## 2023-06-26 DIAGNOSIS — Z95810 Presence of automatic (implantable) cardiac defibrillator: Secondary | ICD-10-CM | POA: Diagnosis not present

## 2023-06-26 DIAGNOSIS — I480 Paroxysmal atrial fibrillation: Secondary | ICD-10-CM | POA: Diagnosis not present

## 2023-07-25 DIAGNOSIS — Z4502 Encounter for adjustment and management of automatic implantable cardiac defibrillator: Secondary | ICD-10-CM | POA: Diagnosis not present

## 2023-07-25 DIAGNOSIS — I5022 Chronic systolic (congestive) heart failure: Secondary | ICD-10-CM | POA: Diagnosis not present

## 2023-07-25 DIAGNOSIS — Z95810 Presence of automatic (implantable) cardiac defibrillator: Secondary | ICD-10-CM | POA: Diagnosis not present

## 2023-08-21 ENCOUNTER — Encounter: Payer: Self-pay | Admitting: Family Medicine

## 2023-08-22 ENCOUNTER — Other Ambulatory Visit: Payer: Self-pay

## 2023-08-22 DIAGNOSIS — L989 Disorder of the skin and subcutaneous tissue, unspecified: Secondary | ICD-10-CM

## 2023-08-22 NOTE — Telephone Encounter (Signed)
 Nurses It would be in the patient's best interest to go ahead and be seen by dermatology regarding leg lesions Please initiate referral and inform Desmen that this has been initiated thank you  Dermatology office should reach out to him to get this scheduled

## 2023-08-29 ENCOUNTER — Encounter: Payer: Self-pay | Admitting: Family Medicine

## 2023-08-30 ENCOUNTER — Encounter: Payer: Self-pay | Admitting: Physician Assistant

## 2023-08-30 ENCOUNTER — Ambulatory Visit: Payer: 59 | Admitting: Physician Assistant

## 2023-08-30 VITALS — BP 122/75 | Temp 97.8°F | Ht 70.0 in | Wt 318.8 lb

## 2023-08-30 DIAGNOSIS — R051 Acute cough: Secondary | ICD-10-CM | POA: Diagnosis not present

## 2023-08-30 DIAGNOSIS — B349 Viral infection, unspecified: Secondary | ICD-10-CM

## 2023-08-30 MED ORDER — OSELTAMIVIR PHOSPHATE 75 MG PO CAPS
75.0000 mg | ORAL_CAPSULE | Freq: Two times a day (BID) | ORAL | 0 refills | Status: AC
Start: 2023-08-30 — End: 2023-09-04

## 2023-08-30 NOTE — Telephone Encounter (Signed)
Nurses Please help out Senovio I am out of the office Thursday Friday I recently saw his mom for respiratory illness Most respiratory illnesses that start up are viral and can last several days before they get better but if he would like to be seen this week please assist with him getting an appointment with Toni Amend or Eber Jones  If he would like to watch it a few days he can do so as long as it is not causing his shortness of breath or fevers-obviously if it persist more than 5 to 6 days I would recommend being seen or if having any worrisome symptoms due to his underlying heart related issues  Thanks-Dr. Lorin Picket

## 2023-08-30 NOTE — Telephone Encounter (Signed)
Patient is scheduled for office visit this morning with PA

## 2023-08-30 NOTE — Progress Notes (Signed)
Acute Office Visit  Subjective:     Patient ID: Jeremy Goodman, male    DOB: February 15, 1981, 43 y.o.   MRN: [id number]   HPI  Patient is in today for cough and fever. 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 Today he reports cough, fever, fatigue, decreased appetite. He states symptoms began yesterday afternoon/evening. He has been taking OTC Mucinex and  Tylenol for symptom control. He report adequate fluid intake.   Review of Systems  Constitutional:  Positive for diaphoresis, fever and malaise/fatigue.  HENT:  Positive for congestion. Negative for ear discharge, ear pain and sore throat.   Respiratory:  Positive for cough and shortness of breath. Negative for wheezing.   Cardiovascular:  Negative for chest pain and palpitations.  Gastrointestinal:  Positive for nausea. Negative for diarrhea, heartburn and vomiting.  Musculoskeletal:  Negative for myalgias.  Neurological:  Negative for headaches.        Objective:     BP 122/75   Temp 97.8 F (36.6 C) (Oral)   Ht 5\' 10"  (1.778 m)   Wt (!) 318 lb 12.8 oz (144.6 kg)   SpO2 95%   BMI 45.74 kg/m   Physical Exam Vitals reviewed.  Constitutional:      General: He is not in acute distress.    Appearance: Normal appearance. He is obese.  HENT:     Nose: Congestion and rhinorrhea present.     Mouth/Throat:     Mouth: Mucous membranes are moist.     Pharynx: Oropharynx is clear.  Eyes:     Extraocular Movements: Extraocular movements intact.     Conjunctiva/sclera: Conjunctivae normal.  Cardiovascular:     Rate and Rhythm: Normal rate and regular rhythm.     Heart sounds: No murmur heard.    No friction rub. No gallop.  Pulmonary:     Effort: Pulmonary effort is normal.     Breath sounds: Normal breath sounds. No stridor. No wheezing, rhonchi or rales.  Musculoskeletal:        General: Normal range of motion.  Skin:    General: Skin is warm and dry.     Capillary Refill:  Capillary refill takes less than 2 seconds.  Neurological:     General: No focal deficit present.     Mental Status: He is alert and oriented to person, place, and time.  Psychiatric:        Mood and Affect: Mood normal.        Behavior: Behavior normal.     No results found for any visits on 08/30/23.      Assessment & Plan:  Viral infection -     Oseltamivir Phosphate; Take 1 capsule (75 mg total) by mouth 2 (two) times daily for 5 days.  Dispense: 10 capsule; Refill: 0  Acute cough    Patient appears stable today. Benign exam. Likely influenza viral infection. With onset of symptoms less than 48 hours ago, patient started on Tamiflu today. Supportive care reviewed with patient. Discussed with patient that there are no indications for antibiotics at this time, and viral respiratory illness can be persistent in duration.Tylenol  for pain or fever as needed. Advised patient less than 3,000mg  of Tylenol daily. May continue with OTC cold medications. Patient instructed to return to clinic if worsening shortness of breath, chest pain, hypoxia, or other concerns. Patient agreeable to plan.    Return if symptoms worsen or fail to improve.  Toni Amend  Adlean Hardeman, PA-C

## 2023-09-09 ENCOUNTER — Other Ambulatory Visit: Payer: Self-pay | Admitting: Family Medicine

## 2023-09-10 ENCOUNTER — Other Ambulatory Visit: Payer: Self-pay

## 2023-09-10 MED ORDER — APIXABAN 5 MG PO TABS: 5.0000 mg | ORAL_TABLET | Freq: Two times a day (BID) | ORAL | 1 refills | Status: DC

## 2023-09-18 ENCOUNTER — Ambulatory Visit: Payer: Medicare Other | Admitting: Family Medicine

## 2023-09-24 ENCOUNTER — Other Ambulatory Visit: Payer: Self-pay | Admitting: Family Medicine

## 2023-09-24 DIAGNOSIS — K219 Gastro-esophageal reflux disease without esophagitis: Secondary | ICD-10-CM

## 2023-09-25 ENCOUNTER — Encounter: Payer: Self-pay | Admitting: Family Medicine

## 2023-09-25 ENCOUNTER — Ambulatory Visit (INDEPENDENT_AMBULATORY_CARE_PROVIDER_SITE_OTHER): Payer: 59 | Admitting: Family Medicine

## 2023-09-25 VITALS — BP 124/76 | HR 76 | Temp 96.9°F | Ht 70.0 in | Wt 325.0 lb

## 2023-09-25 DIAGNOSIS — I480 Paroxysmal atrial fibrillation: Secondary | ICD-10-CM | POA: Diagnosis not present

## 2023-09-25 DIAGNOSIS — R600 Localized edema: Secondary | ICD-10-CM

## 2023-09-25 DIAGNOSIS — E785 Hyperlipidemia, unspecified: Secondary | ICD-10-CM | POA: Diagnosis not present

## 2023-09-25 DIAGNOSIS — Z7901 Long term (current) use of anticoagulants: Secondary | ICD-10-CM | POA: Diagnosis not present

## 2023-09-25 DIAGNOSIS — Z79899 Other long term (current) drug therapy: Secondary | ICD-10-CM

## 2023-09-25 DIAGNOSIS — I5042 Chronic combined systolic (congestive) and diastolic (congestive) heart failure: Secondary | ICD-10-CM | POA: Diagnosis not present

## 2023-09-25 DIAGNOSIS — I10 Essential (primary) hypertension: Secondary | ICD-10-CM | POA: Diagnosis not present

## 2023-09-25 MED ORDER — FUROSEMIDE 20 MG PO TABS: 20.0000 mg | ORAL_TABLET | Freq: Two times a day (BID) | ORAL | 1 refills | Status: DC

## 2023-09-25 MED ORDER — METOPROLOL SUCCINATE ER 100 MG PO TB24: ORAL_TABLET | ORAL | 1 refills | Status: DC

## 2023-09-25 MED ORDER — LISINOPRIL 2.5 MG PO TABS: 2.5000 mg | ORAL_TABLET | Freq: Every day | ORAL | 1 refills | Status: DC

## 2023-09-25 MED ORDER — SPIRONOLACTONE 25 MG PO TABS: 25.0000 mg | ORAL_TABLET | Freq: Every day | ORAL | 1 refills | Status: DC

## 2023-09-25 MED ORDER — MECLIZINE HCL 25 MG PO TABS: 25.0000 mg | ORAL_TABLET | Freq: Three times a day (TID) | ORAL | 1 refills | Status: DC | PRN

## 2023-09-25 NOTE — Progress Notes (Signed)
   Subjective:    Patient ID: Jeremy Goodman, male    DOB: Jan 09, 1981, 43 y.o.   MRN: [id number]  Discussed the use of AI scribe software for clinical note transcription with the patient, who gave verbal consent to proceed.  History of Present Illness   Jeremy Goodman is a 43 year old male who presents for follow-up regarding his defibrillator replacement.  He is experiencing ongoing issues with his defibrillator, which has not yet signaled the need for replacement despite being informed since September that it would require surgery. The device also functions as a pacemaker, and he is undergoing monthly monitoring. He anticipates the surgery might occur around March, coinciding with his birthday.  He is currently taking several medications, including Eliquis twice daily without any bleeding issues, Jardiance, furosemide (Lasix) twice daily, lisinopril, metoprolol, pantoprazole, spironolactone, and occasionally meclizine for vertigo. The furosemide causes nocturia, as he takes the second dose late in the evening. No bleeding issues such as blood in stool or urine are reported.  He continues to experience respiratory symptoms, including a stuffy nose and persistent cough. No new or worsening symptoms related to his respiratory condition are noted.  He describes stress related to family responsibilities, particularly caring for his mother, who has been less active and is experiencing cognitive decline. His mother attended a family event recently but otherwise remains mostly sedentary.         Review of Systems     Objective:    Physical Exam   CHEST: Lungs clear to auscultation. CARDIOVASCULAR: Heart sounds normal.     Patient does have some moderate edema in the lower legs also varicose veins.  No sign of any infection No sign of overt CHF lungs are clear no crackles       Assessment & Plan:  Assessment and Plan    Implantable Cardioverter Defibrillator (ICD) Patient awaiting ICD  generator change. Monthly transmissions monitoring device function. No alarm has been triggered indicating need for immediate replacement. -Continue monthly transmissions. -ICD generator change to be scheduled when alarm is triggered.  Chronic Venous Insufficiency Patient reports leg swelling and visible veins. No signs of infection or ulceration. -Encourage leg elevation when at rest to reduce swelling. -Dermatology appointment scheduled for skin evaluation.  Upper Respiratory Symptoms Patient reports persistent stuffiness and cough. -Recommend use of humidifier and saline nasal spray for symptom relief.  Medication Review Patient confirms adherence to prescribed medications including Eliquis, Jardiance, Lasix, Lisinopril, Metoprolol, Pantoprazole, and Spironolactone. -Continue current medication regimen.  General Health Maintenance -Order blood work and urine protein test. -Encourage healthy dietary habits and stress management. -Follow-up appointment to be scheduled after dermatology evaluation.     1. Primary hypertension (Primary) Decent control check lab work continue current measures - Basic Metabolic Panel - Microalbumin / creatinine urine ratio  2. Class 1 congestive heart failure, chronic, combined (HCC) Under good control currently continue current measures see [facility]ology at [facility] on a regular basis  3. Paroxysmal atrial fibrillation (HCC) Under good control on Eliquis currently no bleeding issues  4. Pedal edema Patient is to try to clean up his diet as well as keep legs propped up when resting  5. Hyperlipidemia, unspecified hyperlipidemia type Continue statin control can keep lipid under good control - Lipid Panel  6. Long term current use of anticoagulant Check CBC tolerating Eliquis well no bleeding issues - CBC  7. High risk medication use Labs ordered - Hepatic Function Panel   Follow-up 6 months

## 2023-09-26 ENCOUNTER — Encounter: Payer: Self-pay | Admitting: Family Medicine

## 2023-09-26 LAB — BASIC METABOLIC PANEL
BUN/Creatinine Ratio: 18 (ref 9–20)
BUN: 15 mg/dL (ref 6–24)
CO2: 19 mmol/L — ABNORMAL LOW (ref 20–29)
Calcium: 9.5 mg/dL (ref 8.7–10.2)
Chloride: 101 mmol/L (ref 96–106)
Creatinine, Ser: 0.85 mg/dL (ref 0.76–1.27)
Glucose: 87 mg/dL (ref 70–99)
Potassium: 4.5 mmol/L (ref 3.5–5.2)
Sodium: 138 mmol/L (ref 134–144)
eGFR: 113 mL/min/{1.73_m2} (ref >59)

## 2023-09-26 LAB — HEPATIC FUNCTION PANEL
ALT: 15 [IU]/L (ref 0–44)
AST: 21 [IU]/L (ref 0–40)
Albumin: 4.3 g/dL (ref 4.1–5.1)
Alkaline Phosphatase: 89 [IU]/L (ref 44–121)
Bilirubin Total: 0.7 mg/dL (ref 0.0–1.2)
Bilirubin, Direct: 0.24 mg/dL (ref 0.00–0.40)
Total Protein: 7.8 g/dL (ref 6.0–8.5)

## 2023-09-26 LAB — CBC
Hematocrit: 43.5 % (ref 37.5–51.0)
Hemoglobin: 14.6 g/dL (ref 13.0–17.7)
MCH: 31.5 pg (ref 26.6–33.0)
MCHC: 33.6 g/dL (ref 31.5–35.7)
MCV: 94 fL (ref 79–97)
Platelets: 164 10*3/uL (ref 150–450)
RBC: 4.63 x10E6/uL (ref 4.14–5.80)
RDW: 12.5 % (ref 11.6–15.4)
WBC: 7.4 10*3/uL (ref 3.4–10.8)

## 2023-09-26 LAB — LIPID PANEL
Chol/HDL Ratio: 3.8 {ratio} (ref 0.0–5.0)
Cholesterol, Total: 148 mg/dL (ref 100–199)
HDL: 39 mg/dL — ABNORMAL LOW (ref >39)
LDL Chol Calc (NIH): 97 mg/dL (ref 0–99)
Triglycerides: 58 mg/dL (ref 0–149)
VLDL Cholesterol Cal: 12 mg/dL (ref 5–40)

## 2023-09-26 LAB — MICROALBUMIN / CREATININE URINE RATIO
Creatinine, Urine: 106.3 mg/dL
Microalb/Creat Ratio: 11 mg/g{creat} (ref 0–29)
Microalbumin, Urine: 12 ug/mL

## 2023-10-09 ENCOUNTER — Ambulatory Visit (INDEPENDENT_AMBULATORY_CARE_PROVIDER_SITE_OTHER): Payer: 59 | Admitting: Dermatology

## 2023-10-09 ENCOUNTER — Encounter: Payer: Self-pay | Admitting: Dermatology

## 2023-10-09 VITALS — BP 115/73 | HR 79

## 2023-10-09 DIAGNOSIS — I872 Venous insufficiency (chronic) (peripheral): Secondary | ICD-10-CM

## 2023-10-09 DIAGNOSIS — Z808 Family history of malignant neoplasm of other organs or systems: Secondary | ICD-10-CM | POA: Diagnosis not present

## 2023-10-09 DIAGNOSIS — Z95810 Presence of automatic (implantable) cardiac defibrillator: Secondary | ICD-10-CM | POA: Diagnosis not present

## 2023-10-09 DIAGNOSIS — I83813 Varicose veins of bilateral lower extremities with pain: Secondary | ICD-10-CM

## 2023-10-09 DIAGNOSIS — I471 Supraventricular tachycardia, unspecified: Secondary | ICD-10-CM | POA: Diagnosis not present

## 2023-10-09 NOTE — Progress Notes (Signed)
   New Patient Visit   Subjective  Jeremy Goodman is a 43 y.o. male who presents for the following: spot of concern on left lower leg, present for several months. Noted by PCP. Patient is not concerned and thinks it may be related to varicose veins  No hx of skin cancer.  Family hx of NMSC.  The patient has spots, moles and lesions to be evaluated, some may be new or changing and the patient may have concern these could be cancer.   The following portions of the chart were reviewed this encounter and updated as appropriate: medications, allergies, medical history  Review of Systems:  No other skin or systemic complaints except as noted in HPI or Assessment and Plan.  Objective  Well appearing patient in no apparent distress; mood and affect are within normal limits.    A focused examination was performed of the following areas:  Bilateral Lower Extremities  Relevant exam findings are noted in the Assessment and Plan.    Assessment & Plan   Varicose Veins 2/2 Chronic Heart Failure- Violaceous nodules with surrounding tortuous veins on left lower leg and right lower leg- Not at treatment goal The patient was counseled regarding severe varicose veins secondary to underlying heart failure, which has contributed to venous hypertension and chronic venous insufficiency. Examination findings suggest that the lesions correspond to superficial portions of the veins surrounded by chronic inflammation, likely due to prolonged venous stasis. The importance of leg elevation, regular movement, and skin care to prevent ulceration and infection was discussed. We also reviewed the benefits of compression stockings in improving venous return and reducing swelling, emphasizing the need for proper fit and consistent use. The patient was advised to monitor for worsening symptoms, including pain, skin changes, or signs of ulceration, and to follow up regularly for ongoing management. - Rx written for 20-30 mm Hg  compression socks  VARICOSE VEINS OF BOTH LOWER EXTREMITIES WITH PAIN (6) Left Ankle - Anterior, Left Foot - Anterior, Left Lower Leg - Anterior, Right Ankle - Anterior, Right Foot - Anterior, Right Lower Leg - Anterior 20.5 in compression socks with 20-30 mmhg to help reduce swelling.   STASIS DERMATITIS Exam: Erythematous, scaly patches involving the ankle and distal lower leg with associated lower leg edema.  Flared  Stasis in the legs causes chronic leg swelling, which may result in itchy or painful rashes, skin discoloration, skin texture changes, and sometimes ulceration.  Recommend daily graduated compression hose/stockings- easiest to put on first thing in morning, remove at bedtime.  Elevate legs as much as possible. Avoid salt/sodium rich foods.  Treatment Plan: Compression stockings  No follow-ups on file.  Dominga Ferry, Surg Tech III, am acting as scribe for Gwenith Daily, MD.   Documentation: I have reviewed the above documentation for accuracy and completeness, and I agree with the above.  Gwenith Daily, MD

## 2023-10-09 NOTE — Patient Instructions (Signed)

## 2023-11-21 ENCOUNTER — Other Ambulatory Visit: Payer: Self-pay | Admitting: Family Medicine

## 2023-12-12 DIAGNOSIS — Q249 Congenital malformation of heart, unspecified: Secondary | ICD-10-CM | POA: Diagnosis not present

## 2023-12-12 DIAGNOSIS — Q243 Pulmonary infundibular stenosis: Secondary | ICD-10-CM | POA: Diagnosis not present

## 2023-12-12 DIAGNOSIS — Z01818 Encounter for other preprocedural examination: Secondary | ICD-10-CM | POA: Diagnosis not present

## 2023-12-12 DIAGNOSIS — I519 Heart disease, unspecified: Secondary | ICD-10-CM | POA: Diagnosis not present

## 2023-12-12 DIAGNOSIS — Z9889 Other specified postprocedural states: Secondary | ICD-10-CM | POA: Diagnosis not present

## 2023-12-12 DIAGNOSIS — Z4502 Encounter for adjustment and management of automatic implantable cardiac defibrillator: Secondary | ICD-10-CM | POA: Diagnosis not present

## 2023-12-19 DIAGNOSIS — Z95810 Presence of automatic (implantable) cardiac defibrillator: Secondary | ICD-10-CM | POA: Diagnosis not present

## 2023-12-19 DIAGNOSIS — I5022 Chronic systolic (congestive) heart failure: Secondary | ICD-10-CM | POA: Diagnosis not present

## 2023-12-19 DIAGNOSIS — Q221 Congenital pulmonary valve stenosis: Secondary | ICD-10-CM | POA: Diagnosis not present

## 2023-12-19 DIAGNOSIS — Z4502 Encounter for adjustment and management of automatic implantable cardiac defibrillator: Secondary | ICD-10-CM | POA: Diagnosis not present

## 2023-12-19 DIAGNOSIS — R9431 Abnormal electrocardiogram [ECG] [EKG]: Secondary | ICD-10-CM | POA: Diagnosis not present

## 2023-12-19 DIAGNOSIS — I370 Nonrheumatic pulmonary valve stenosis: Secondary | ICD-10-CM | POA: Diagnosis not present

## 2023-12-19 DIAGNOSIS — Z7901 Long term (current) use of anticoagulants: Secondary | ICD-10-CM | POA: Diagnosis not present

## 2023-12-19 DIAGNOSIS — I517 Cardiomegaly: Secondary | ICD-10-CM | POA: Diagnosis not present

## 2024-01-08 ENCOUNTER — Other Ambulatory Visit: Payer: Self-pay | Admitting: Family Medicine

## 2024-01-08 DIAGNOSIS — Z8679 Personal history of other diseases of the circulatory system: Secondary | ICD-10-CM | POA: Diagnosis not present

## 2024-01-08 DIAGNOSIS — Z95810 Presence of automatic (implantable) cardiac defibrillator: Secondary | ICD-10-CM | POA: Diagnosis not present

## 2024-01-08 DIAGNOSIS — I517 Cardiomegaly: Secondary | ICD-10-CM | POA: Diagnosis not present

## 2024-01-08 DIAGNOSIS — I480 Paroxysmal atrial fibrillation: Secondary | ICD-10-CM | POA: Diagnosis not present

## 2024-01-08 DIAGNOSIS — Z9889 Other specified postprocedural states: Secondary | ICD-10-CM | POA: Diagnosis not present

## 2024-01-08 DIAGNOSIS — Q203 Discordant ventriculoarterial connection: Secondary | ICD-10-CM | POA: Diagnosis not present

## 2024-01-08 DIAGNOSIS — I4892 Unspecified atrial flutter: Secondary | ICD-10-CM | POA: Diagnosis not present

## 2024-01-08 DIAGNOSIS — Z4502 Encounter for adjustment and management of automatic implantable cardiac defibrillator: Secondary | ICD-10-CM | POA: Diagnosis not present

## 2024-01-22 DIAGNOSIS — Z4502 Encounter for adjustment and management of automatic implantable cardiac defibrillator: Secondary | ICD-10-CM | POA: Diagnosis not present

## 2024-01-22 DIAGNOSIS — Z95810 Presence of automatic (implantable) cardiac defibrillator: Secondary | ICD-10-CM | POA: Diagnosis not present

## 2024-01-22 DIAGNOSIS — I4892 Unspecified atrial flutter: Secondary | ICD-10-CM | POA: Diagnosis not present

## 2024-01-22 DIAGNOSIS — Z8679 Personal history of other diseases of the circulatory system: Secondary | ICD-10-CM | POA: Diagnosis not present

## 2024-01-22 DIAGNOSIS — I517 Cardiomegaly: Secondary | ICD-10-CM | POA: Diagnosis not present

## 2024-01-22 DIAGNOSIS — Z9889 Other specified postprocedural states: Secondary | ICD-10-CM | POA: Diagnosis not present

## 2024-01-22 DIAGNOSIS — Q203 Discordant ventriculoarterial connection: Secondary | ICD-10-CM | POA: Diagnosis not present

## 2024-01-22 DIAGNOSIS — I480 Paroxysmal atrial fibrillation: Secondary | ICD-10-CM | POA: Diagnosis not present

## 2024-02-21 DIAGNOSIS — I4892 Unspecified atrial flutter: Secondary | ICD-10-CM | POA: Diagnosis not present

## 2024-02-21 DIAGNOSIS — Q243 Pulmonary infundibular stenosis: Secondary | ICD-10-CM | POA: Diagnosis not present

## 2024-02-21 DIAGNOSIS — Q249 Congenital malformation of heart, unspecified: Secondary | ICD-10-CM | POA: Diagnosis not present

## 2024-02-21 DIAGNOSIS — Q203 Discordant ventriculoarterial connection: Secondary | ICD-10-CM | POA: Diagnosis not present

## 2024-02-29 ENCOUNTER — Other Ambulatory Visit: Payer: Self-pay | Admitting: Family Medicine

## 2024-03-15 ENCOUNTER — Other Ambulatory Visit: Payer: Self-pay | Admitting: Family Medicine

## 2024-03-15 DIAGNOSIS — K219 Gastro-esophageal reflux disease without esophagitis: Secondary | ICD-10-CM

## 2024-03-17 ENCOUNTER — Other Ambulatory Visit: Payer: Self-pay

## 2024-03-17 DIAGNOSIS — K219 Gastro-esophageal reflux disease without esophagitis: Secondary | ICD-10-CM

## 2024-03-17 MED ORDER — PANTOPRAZOLE SODIUM 40 MG PO TBEC: 40.0000 mg | DELAYED_RELEASE_TABLET | Freq: Every day | ORAL | 1 refills | Status: DC

## 2024-03-21 DIAGNOSIS — Z8679 Personal history of other diseases of the circulatory system: Secondary | ICD-10-CM | POA: Diagnosis not present

## 2024-03-21 DIAGNOSIS — Z9889 Other specified postprocedural states: Secondary | ICD-10-CM | POA: Diagnosis not present

## 2024-03-21 DIAGNOSIS — Z95810 Presence of automatic (implantable) cardiac defibrillator: Secondary | ICD-10-CM | POA: Diagnosis not present

## 2024-03-21 DIAGNOSIS — Z4502 Encounter for adjustment and management of automatic implantable cardiac defibrillator: Secondary | ICD-10-CM | POA: Diagnosis not present

## 2024-03-21 DIAGNOSIS — I472 Ventricular tachycardia, unspecified: Secondary | ICD-10-CM | POA: Diagnosis not present

## 2024-03-25 ENCOUNTER — Ambulatory Visit: Payer: 59 | Admitting: Family Medicine

## 2024-03-25 VITALS — BP 96/50 | HR 77 | Temp 97.0°F | Ht 70.0 in | Wt 331.0 lb

## 2024-03-25 DIAGNOSIS — I480 Paroxysmal atrial fibrillation: Secondary | ICD-10-CM

## 2024-03-25 DIAGNOSIS — Z7901 Long term (current) use of anticoagulants: Secondary | ICD-10-CM

## 2024-03-25 DIAGNOSIS — I5042 Chronic combined systolic (congestive) and diastolic (congestive) heart failure: Secondary | ICD-10-CM | POA: Diagnosis not present

## 2024-03-25 NOTE — Progress Notes (Signed)
   Subjective:    Patient ID: Jeremy Goodman, male    DOB: June 01, 1981, 43 y.o.   MRN: [id number]  HPI  6 month follow up htn, chf,, afib, hyperlipidemia Patient has underlying congenital heart disease Followed by Madie Mask his medications regular basis Denies any chest tightness pressure pain palpitations Recently had his defibrillator battery and components replaced Does get intermittent swelling in the legs has history of varicose veins Takes care of his mother under a fair amount of stress Difficult for him to fit in exercise and healthy eating Also suffers with morbid obesity tries the best he can Review of Systems     Objective:   Physical Exam  General-in no acute distress Eyes-no discharge Lungs-respiratory rate normal, CTA CV-no murmurs,RRR Extremities skin warm dry no edema Neuro grossly normal Behavior normal, alert Patient does have varicose veins worse on the left than the right      Assessment & Plan:   1. Morbid obesity (HCC) Portion control regular physical activity try to keep weight down  2. Paroxysmal atrial fibrillation (HCC) (Primary) On anticoagulation no bleeding issues  3. Long term current use of anticoagulant No bleeding issues warning signs discussed  4. Class 1 congestive heart failure, chronic, combined (HCC) Patient's blood pressure on the low end but he denies drowsiness.  Recent blood pressure at cardiologist look good No adjustments on medicine Keep all as is No lab work on this visit Will do lab work before next visit

## 2024-04-22 DIAGNOSIS — Q243 Pulmonary infundibular stenosis: Secondary | ICD-10-CM | POA: Diagnosis not present

## 2024-04-22 DIAGNOSIS — I480 Paroxysmal atrial fibrillation: Secondary | ICD-10-CM | POA: Diagnosis not present

## 2024-04-22 DIAGNOSIS — Q203 Discordant ventriculoarterial connection: Secondary | ICD-10-CM | POA: Diagnosis not present

## 2024-04-22 DIAGNOSIS — Z8679 Personal history of other diseases of the circulatory system: Secondary | ICD-10-CM | POA: Diagnosis not present

## 2024-04-22 DIAGNOSIS — I4892 Unspecified atrial flutter: Secondary | ICD-10-CM | POA: Diagnosis not present

## 2024-04-22 DIAGNOSIS — Q248 Other specified congenital malformations of heart: Secondary | ICD-10-CM | POA: Diagnosis not present

## 2024-04-22 DIAGNOSIS — Q249 Congenital malformation of heart, unspecified: Secondary | ICD-10-CM | POA: Diagnosis not present

## 2024-04-22 DIAGNOSIS — I519 Heart disease, unspecified: Secondary | ICD-10-CM | POA: Diagnosis not present

## 2024-04-22 DIAGNOSIS — Z9889 Other specified postprocedural states: Secondary | ICD-10-CM | POA: Diagnosis not present

## 2024-04-22 DIAGNOSIS — Z4502 Encounter for adjustment and management of automatic implantable cardiac defibrillator: Secondary | ICD-10-CM | POA: Diagnosis not present

## 2024-05-05 DIAGNOSIS — Z8679 Personal history of other diseases of the circulatory system: Secondary | ICD-10-CM | POA: Diagnosis not present

## 2024-05-05 DIAGNOSIS — Z4502 Encounter for adjustment and management of automatic implantable cardiac defibrillator: Secondary | ICD-10-CM | POA: Diagnosis not present

## 2024-05-05 DIAGNOSIS — Z95810 Presence of automatic (implantable) cardiac defibrillator: Secondary | ICD-10-CM | POA: Diagnosis not present

## 2024-05-05 DIAGNOSIS — J986 Disorders of diaphragm: Secondary | ICD-10-CM | POA: Diagnosis not present

## 2024-05-05 DIAGNOSIS — Z9889 Other specified postprocedural states: Secondary | ICD-10-CM | POA: Diagnosis not present

## 2024-05-05 DIAGNOSIS — I517 Cardiomegaly: Secondary | ICD-10-CM | POA: Diagnosis not present

## 2024-05-05 DIAGNOSIS — I480 Paroxysmal atrial fibrillation: Secondary | ICD-10-CM | POA: Diagnosis not present

## 2024-05-05 DIAGNOSIS — I4892 Unspecified atrial flutter: Secondary | ICD-10-CM | POA: Diagnosis not present

## 2024-05-05 DIAGNOSIS — Q203 Discordant ventriculoarterial connection: Secondary | ICD-10-CM | POA: Diagnosis not present

## 2024-05-11 ENCOUNTER — Other Ambulatory Visit: Payer: Self-pay | Admitting: Family Medicine

## 2024-05-13 ENCOUNTER — Other Ambulatory Visit: Payer: Self-pay | Admitting: Family Medicine

## 2024-05-15 ENCOUNTER — Encounter (HOSPITAL_COMMUNITY): Payer: Self-pay | Admitting: *Deleted

## 2024-05-15 ENCOUNTER — Emergency Department (HOSPITAL_COMMUNITY)
Admission: EM | Admit: 2024-05-15 | Discharge: 2024-05-15 | Disposition: A | Discharge status: Home / Self Care | Attending: Emergency Medicine | Admitting: Emergency Medicine

## 2024-05-15 ENCOUNTER — Other Ambulatory Visit: Payer: Self-pay

## 2024-05-15 DIAGNOSIS — I4891 Unspecified atrial fibrillation: Secondary | ICD-10-CM | POA: Diagnosis not present

## 2024-05-15 DIAGNOSIS — Z7901 Long term (current) use of anticoagulants: Secondary | ICD-10-CM | POA: Insufficient documentation

## 2024-05-15 DIAGNOSIS — M79605 Pain in left leg: Secondary | ICD-10-CM | POA: Diagnosis present

## 2024-05-15 DIAGNOSIS — I83892 Varicose veins of left lower extremities with other complications: Secondary | ICD-10-CM | POA: Insufficient documentation

## 2024-05-15 NOTE — ED Triage Notes (Signed)
 Pt with bleeding varicose vein to left lower leg, bleeding controlled at present.  EMS reports about 500 cc loss of blood. 137/75, HR 80, RA 98 % pt is on blood thinner Eliquis  for hx Afib

## 2024-05-15 NOTE — ED Notes (Signed)
 EDP in room, applied QuickClot to site with abd dsg, wrapped with coban

## 2024-05-15 NOTE — Discharge Instructions (Signed)
 Take the bandage off tomorrow and return if any problems

## 2024-05-19 NOTE — ED Provider Notes (Signed)
 Jeremy Goodman EMERGENCY DEPARTMENT AT [facility] Provider Note   CSN: [id number] Arrival date & time: 05/15/24  1051     Patient presents with: Varicose Veins   Jeremy Goodman is a 43 y.o. male.   Patient is on blood thinners and had some bleeding from his left lower leg.  The history is provided by the patient.  Leg Pain Location:  Leg Injury: no   Leg location:  L leg Pain details:    Quality:  Aching   Radiates to:  Does not radiate   Severity:  Mild   Onset quality:  Sudden   Timing:  Constant Chronicity:  New Associated symptoms: no back pain and no fatigue        Prior to Admission medications   Medication Sig Start Date End Date Taking? Authorizing Provider  Dextromethorphan-Guaifenesin (MUCINEX DM MAXIMUM STRENGTH) 60-1200 MG TB12 Take by mouth as needed. Reported on 11/25/2015    Provider, Historical, Goodman  diphenhydrAMINE (SOMINEX) 25 MG tablet Take 25 mg by mouth at bedtime as needed for sleep. Reported on 11/25/2015    Provider, Historical, Goodman  ELIQUIS  5 MG TABS tablet TAKE ONE TABLET BY MOUTH TWICE DAILY 02/29/24   Jeremy Goodman LABOR, Goodman  empagliflozin (JARDIANCE) 25 MG TABS tablet  09/22/20   Provider, Historical, Goodman  furosemide  (LASIX ) 20 MG tablet Take 1 tablet (20 mg total) by mouth 2 (two) times daily. 05/12/24   Jeremy Goodman LABOR, Goodman  ketoconazole  (NIZORAL ) 2 % cream Apply 1 Application topically 2 (two) times daily. 03/14/23   Jeremy Goodman LABOR, Goodman  lisinopril  (ZESTRIL ) 2.5 MG tablet Take 1 tablet (2.5 mg total) by mouth daily. 11/21/23   Jeremy Goodman LABOR, Goodman  meclizine  (ANTIVERT ) 25 MG tablet TAKE ONE TABLET BY MOUTH THREE TIMES DAILY AS NEEDED FOR DIZZINESS 05/13/24   Jeremy Goodman LABOR, Goodman  metoprolol  succinate (TOPROL -XL) 100 MG 24 hr tablet TAKE 1 TABLET BY MOUTH ONCE DAILY WITH A MEAL. 09/25/23   Jeremy Goodman LABOR, Goodman  nystatin  cream (MYCOSTATIN ) Apply 1 application. topically 2 (two) times daily. 12/29/21   Jeremy, Goodman Goodman., Goodman  ondansetron  (ZOFRAN -ODT) 4 MG  disintegrating tablet Take 1 tablet (4 mg total) by mouth every 8 (eight) hours as needed for nausea. 09/23/21   Jeremy Rogue, Goodman  pantoprazole  (PROTONIX ) 40 MG tablet Take 1 tablet (40 mg total) by mouth daily. 03/17/24   Jeremy Goodman LABOR, Goodman  simethicone (MYLICON) 125 MG chewable tablet 125 mg. Take 125 mg by mouth every 6 (six) hours as needed.    Provider, Historical, Goodman  spironolactone  (ALDACTONE ) 25 MG tablet TAKE ONE TABLET BY MOUTH DAILY 05/12/24   Jeremy Goodman LABOR, Goodman    Allergies: Levaquin [levofloxacin in d5w]    Review of Systems  Constitutional:  Negative for appetite change and fatigue.  HENT:  Negative for congestion, ear discharge and sinus pressure.   Eyes:  Negative for discharge.  Respiratory:  Negative for cough.   Cardiovascular:  Negative for chest pain.  Gastrointestinal:  Negative for abdominal pain and diarrhea.  Genitourinary:  Negative for frequency and hematuria.  Musculoskeletal:  Negative for back pain.       Bleeding from left lower leg over varicose vein  Skin:  Negative for rash.  Neurological:  Negative for seizures and headaches.  Psychiatric/Behavioral:  Negative for hallucinations.     Updated Vital Signs BP (!) 117/59   Pulse 74   Temp 97.9 F (36.6 C) (Oral)   Resp 18  Ht 5' 10 (1.778 m)   Wt (!) 149.2 kg   SpO2 95%   BMI 47.21 kg/m   Physical Exam Vitals and nursing note reviewed.  Constitutional:      Appearance: He is well-developed.  HENT:     Head: Normocephalic.     Nose: Nose normal.  Eyes:     General: No scleral icterus.    Conjunctiva/sclera: Conjunctivae normal.  Neck:     Thyroid: No thyromegaly.  Cardiovascular:     Rate and Rhythm: Normal rate and regular rhythm.     Heart sounds: No murmur heard.    No friction rub. No gallop.  Pulmonary:     Breath sounds: No stridor. No wheezing or rales.  Chest:     Chest wall: No tenderness.  Abdominal:     General: There is no distension.     Tenderness: There is no  abdominal tenderness. There is no rebound.  Musculoskeletal:        General: Normal range of motion.     Cervical back: Neck supple.     Comments: Varicose vein that has clotted blood after bandage on the left leg  Lymphadenopathy:     Cervical: No cervical adenopathy.  Skin:    Findings: No erythema or rash.  Neurological:     Mental Status: He is alert and oriented to person, place, and time.     Motor: No abnormal muscle tone.     Coordination: Coordination normal.  Psychiatric:        Behavior: Behavior normal.     (all labs ordered are listed, but only abnormal results are displayed) Labs Reviewed - No data to display  EKG: None  Radiology: No results found.   Procedures   Medications Ordered in the ED - No data to display                                  Medical Decision Making  Patient with bleeding from varicose vein that has improved with pressure.  He will follow-up as needed     Final diagnoses:  Varicose veins of leg with edema, left    ED Discharge Orders     None          Suzette Pac, Goodman 05/19/24 [phone]

## 2024-05-23 ENCOUNTER — Encounter: Payer: Self-pay | Admitting: Family Medicine

## 2024-05-23 ENCOUNTER — Other Ambulatory Visit: Payer: Self-pay | Admitting: Family Medicine

## 2024-05-30 ENCOUNTER — Ambulatory Visit

## 2024-05-30 VITALS — Ht 70.0 in | Wt 329.0 lb

## 2024-05-30 DIAGNOSIS — Z0000 Encounter for general adult medical examination without abnormal findings: Secondary | ICD-10-CM

## 2024-05-30 NOTE — Progress Notes (Signed)
 Patient is due for labs. Please order if appropriate.  Subjective:   Jeremy Goodman is a 43 y.o. who presents for a Medicare Wellness preventive visit.  As a reminder, Annual Wellness Visits don't include a physical exam, and some assessments may be limited, especially if this visit is performed virtually. We may recommend an in-person follow-up visit with your provider if needed.  Visit Complete: Virtual I connected with  Selinda Sharps on 05/30/24 by a audio enabled telemedicine application and verified that I am speaking with the correct person using two identifiers.  Patient Location: Home  Provider Location: Home Office  I discussed the limitations of evaluation and management by telemedicine. The patient expressed understanding and agreed to proceed.  Vital Signs: Because this visit was a virtual/telehealth visit, some criteria may be missing or patient reported. Any vitals not documented were not able to be obtained and vitals that have been documented are patient reported.  VideoDeclined- This patient declined Librarian, academic. Therefore the visit was completed with audio only.  Persons Participating in Visit: Patient.  AWV Questionnaire: No: Patient Medicare AWV questionnaire was not completed prior to this visit.  Cardiac Risk Factors include: advanced age (>34men, >26 women);dyslipidemia;hypertension;male gender;obesity (BMI >30kg/m2)     Objective:    Today's Vitals   05/30/24 0805  Weight: (!) 329 lb (149.2 kg)  Height: 5' 10 (1.778 m)  PainSc: 0-No pain   Body mass index is 47.21 kg/m.     05/30/2024    8:11 AM 05/15/2024   11:03 AM 09/23/2021    2:20 PM 09/02/2012    6:37 PM  Advanced Directives  Does Patient Have a Medical Advance Directive? No No No Patient does not have advance directive;Patient would not like information   Would patient like information on creating a medical advance directive? Yes (MAU/Ambulatory/Procedural Areas  - Information given)     Pre-existing out of facility DNR order (yellow form or pink MOST form)    No      Data saved with a previous flowsheet row definition    Current Medications (verified) Outpatient Encounter Medications as of 05/30/2024  Medication Sig   Dextromethorphan-Guaifenesin (MUCINEX DM MAXIMUM STRENGTH) 60-1200 MG TB12 Take by mouth as needed. Reported on 11/25/2015   diphenhydrAMINE (SOMINEX) 25 MG tablet Take 25 mg by mouth at bedtime as needed for sleep. Reported on 11/25/2015   ELIQUIS  5 MG TABS tablet TAKE ONE TABLET BY MOUTH TWICE DAILY   empagliflozin (JARDIANCE) 25 MG TABS tablet    furosemide  (LASIX ) 20 MG tablet Take 1 tablet (20 mg total) by mouth 2 (two) times daily.   ketoconazole  (NIZORAL ) 2 % cream Apply 1 Application topically 2 (two) times daily.   lisinopril  (ZESTRIL ) 2.5 MG tablet Take 1 tablet (2.5 mg total) by mouth daily.   meclizine  (ANTIVERT ) 25 MG tablet TAKE ONE TABLET BY MOUTH THREE TIMES DAILY AS NEEDED FOR DIZZINESS   metoprolol  succinate (TOPROL -XL) 100 MG 24 hr tablet TAKE ONE TABLET BY MOUTH EVERY DAY WITH A MEAL   nystatin  cream (MYCOSTATIN ) Apply 1 application. topically 2 (two) times daily.   ondansetron  (ZOFRAN -ODT) 4 MG disintegrating tablet Take 1 tablet (4 mg total) by mouth every 8 (eight) hours as needed for nausea.   pantoprazole  (PROTONIX ) 40 MG tablet Take 1 tablet (40 mg total) by mouth daily.   simethicone (MYLICON) 125 MG chewable tablet 125 mg. Take 125 mg by mouth every 6 (six) hours as needed.   spironolactone  (ALDACTONE ) 25 MG  tablet TAKE ONE TABLET BY MOUTH DAILY   No facility-administered encounter medications on file as of 05/30/2024.    Allergies (verified) Levaquin [levofloxacin in d5w]   History: Past Medical History:  Diagnosis Date   Anginal pain    C. difficile diarrhea    2 weeks ago prior to admission 1/20 tx'ed with Flagyl    CHF (congestive heart failure) (HCC)    HTN (hypertension) 08/21/2014    Transposition of great vessel    Past Surgical History:  Procedure Laterality Date   CORONARY ARTERY BYPASS GRAFT     IMPLANTABLE CARDIOVERTER DEFIBRILLATOR IMPLANT     Family History  Problem Relation Age of Onset   Atrial fibrillation Father    Other Father        s/p pacemaker    Social History   Socioeconomic History   Marital status: Single    Spouse name: Not on file   Number of children: Not on file   Years of education: Not on file   Highest education level: 12th grade  Occupational History   Not on file  Tobacco Use   Smoking status: Never   Smokeless tobacco: Never  Substance and Sexual Activity   Alcohol use: No   Drug use: No   Sexual activity: Not Currently  Other Topics Concern   Not on file  Social History Narrative   Not on file   Social Drivers of Health   Financial Resource Strain: Low Risk  (05/30/2024)   Overall Financial Resource Strain (CARDIA)    Difficulty of Paying Living Expenses: Not hard at all  Food Insecurity: No Food Insecurity (05/30/2024)   Hunger Vital Sign    Worried About Running Out of Food in the Last Year: Never true    Ran Out of Food in the Last Year: Never true  Transportation Needs: No Transportation Needs (05/30/2024)   PRAPARE - Administrator, Civil Service (Medical): No    Lack of Transportation (Non-Medical): No  Physical Activity: Inactive (05/30/2024)   Exercise Vital Sign    Days of Exercise per Week: 0 days    Minutes of Exercise per Session: 0 min  Stress: No Stress Concern Present (05/30/2024)   [employer] of Occupational Health - Occupational Stress Questionnaire    Feeling of Stress: Not at all  Recent Concern: Stress - Stress Concern Present (03/25/2024)   [employer] of Occupational Health - Occupational Stress Questionnaire    Feeling of Stress: To some extent  Social Connections: Moderately Integrated (05/30/2024)   Social Connection and Isolation Panel    Frequency of  Communication with Friends and Family: More than three times a week    Frequency of Social Gatherings with Friends and Family: More than three times a week    Attends Religious Services: More than 4 times per year    Active Member of [employer] or Organizations: Yes    Attends Engineer, structural: More than 4 times per year    Marital Status: Never married    Tobacco Counseling Counseling given: Yes    Clinical Intake:  Pre-visit preparation completed: Yes  Pain : No/denies pain Pain Score: 0-No pain     BMI - recorded: 47.21 Nutritional Status: BMI > 30  Obese Nutritional Risks: None Diabetes: No  Lab Results  Component Value Date   HGBA1C 5.6 03/07/2023   HGBA1C 5.5 09/13/2022   HGBA1C 5.6 10/07/2021     How often do you need to have someone help  you when you read instructions, pamphlets, or other written materials from your doctor or pharmacy?: 1 - Never  Interpreter Needed?: No  Information entered by :: Donya Hitch W CMA (AAMA)   Activities of Daily Living     05/30/2024    8:07 AM  In your present state of health, do you have any difficulty performing the following activities:  Hearing? 0  Vision? 0  Difficulty concentrating or making decisions? 0  Walking or climbing stairs? 0  Dressing or bathing? 0  Doing errands, shopping? 0  Preparing Food and eating ? N  Using the Toilet? N  In the past six months, have you accidently leaked urine? N  Do you have problems with loss of bowel control? N  Managing your Medications? N  Managing your Finances? N  Housekeeping or managing your Housekeeping? N    Patient Care Team: Alphonsa Glendia LABOR, MD as PCP - General (Family Medicine) Dr Willma Moats Optometrist, [place], OD (Optometry) Wendell Boone LABOR, MD (Internal Medicine) Ward, Rodgers, MD as Referring Physician (Internal Medicine) Anner Earnie Meeker as Consulting Physician (Cardiology) Onesimo Oneil LABOR, MD as Consulting Physician (Orthopedic Surgery)  I have  updated your Care Teams any recent Medical Services you may have received from other providers in the past year.     Assessment:   This is a routine wellness examination for [place].  Hearing/Vision screen Hearing Screening - Comments:: Patient denies any hearing difficulties.   Vision Screening - Comments:: Patient is not up to date on yearly eye exams.he will call and schedule the appointment   Goals Addressed               This Visit's Progress     I want to lose 10 pounds (pt-stated)          Depression Screen     05/30/2024    8:14 AM 09/25/2023    8:54 AM 08/30/2023   11:42 AM 03/14/2023    8:50 AM 09/13/2022    8:48 AM 05/17/2021    8:28 AM 12/01/2020    8:50 AM  PHQ 2/9 Scores  PHQ - 2 Score 0 1  0 1 0 0  PHQ- 9 Score 4 8  1 6     Exception Documentation   Patient refusal         Fall Risk     05/30/2024    8:13 AM 03/25/2024    8:25 AM 09/25/2023    8:54 AM 02/28/2023   10:20 AM 09/13/2022    8:48 AM  Fall Risk   Falls in the past year? 0 0 0 1 0  Number falls in past yr: 0 0  0 0  Injury with Fall? 0 0 0 1 0  Risk for fall due to : No Fall Risks No Fall Risks     Follow up Falls evaluation completed;Education provided;Falls prevention discussed Falls evaluation completed       MEDICARE RISK AT HOME:  Medicare Risk at Home Any stairs in or around the home?: Yes If so, are there any without handrails?: No Home free of loose throw rugs in walkways, pet beds, electrical cords, etc?: Yes Adequate lighting in your home to reduce risk of falls?: Yes Life alert?: No Use of a cane, walker or w/c?: No Grab bars in the bathroom?: No Shower chair or bench in shower?: No Elevated toilet seat or a handicapped toilet?: No  TIMED UP AND GO:  Was the test performed?  No  Cognitive Function:  6CIT completed        05/30/2024    8:13 AM  6CIT Screen  What Year? 0 points  What month? 0 points  What time? 0 points  Count back from 20 0 points  Months in  reverse 0 points  Repeat phrase 0 points  Total Score 0 points    Immunizations Immunization History  Administered Date(s) Administered   Influenza Nasal 08/26/2014   Influenza, Seasonal, Injecte, Preservative Fre 07/03/2013, 06/22/2023   Influenza,inj,Quad PF,6+ Mos 05/25/2015, 08/24/2016, 08/24/2017, 08/23/2018, 05/24/2020, 05/17/2021, 08/16/2022   Influenza-Unspecified 08/05/2019   Moderna Sars-Covid-2 Vaccination 11/03/2019, 12/01/2019   Pneumococcal Polysaccharide-23 05/15/2002   Td 01/16/2003    Screening Tests Health Maintenance  Topic Date Due   HIV Screening  Never done   Hepatitis C Screening  Never done   Hepatitis B Vaccines 19-59 Average Risk (1 of 3 - 19+ 3-dose series) Never done   HPV VACCINES (1 - 3-dose SCDM series) Never done   DTaP/Tdap/Td (2 - Tdap) 01/15/2013   COVID-19 Vaccine (3 - Moderna risk series) 12/29/2019   Influenza Vaccine  03/14/2024   Pneumococcal Vaccine (2 of 2 - PCV) 09/24/2024 (Originally 05/16/2003)   Medicare Annual Wellness (AWV)  05/30/2025   Meningococcal B Vaccine  Aged Out    Health Maintenance Health Maintenance Due  Topic Date Due   HIV Screening  Never done   Hepatitis C Screening  Never done   Hepatitis B Vaccines 19-59 Average Risk (1 of 3 - 19+ 3-dose series) Never done   HPV VACCINES (1 - 3-dose SCDM series) Never done   DTaP/Tdap/Td (2 - Tdap) 01/15/2013   COVID-19 Vaccine (3 - Moderna risk series) 12/29/2019   Influenza Vaccine  03/14/2024   Health Maintenance Items Addressed: Patient aware of recommended health screenings and any vaccines that are due  Additional Screening:  Vision Screening: Recommended annual ophthalmology exams for early detection of glaucoma and other disorders of the eye. Would you like a referral to an eye doctor? No    Dental Screening: Recommended annual dental exams for proper oral hygiene  Community Resource Referral / Chronic Care Management: CRR required this visit?  No   CCM  required this visit?  No   Plan:    I have personally reviewed and noted the following in the patient's chart:   Medical and social history Use of alcohol, tobacco or illicit drugs  Current medications and supplements including opioid prescriptions. Patient is not currently taking opioid prescriptions. Functional ability and status Nutritional status Physical activity Advanced directives List of other physicians Hospitalizations, surgeries, and ER visits in previous 12 months Vitals Screenings to include cognitive, depression, and falls Referrals and appointments  In addition, I have reviewed and discussed with patient certain preventive protocols, quality metrics, and best practice recommendations. A written personalized care plan for preventive services as well as general preventive health recommendations were provided to patient.   Griselda Bramblett, CMA   05/30/2024   After Visit Summary: (MyChart) Due to this being a telephonic visit, the after visit summary with patients personalized plan was offered to patient via MyChart   Notes: see note at top of ov note

## 2024-05-30 NOTE — Patient Instructions (Signed)
 Mr. Jeremy Goodman,  Thank you for taking the time for your Medicare Wellness Visit. I appreciate your continued commitment to your health goals. Please review the care plan we discussed, and feel free to reach out if I can assist you further.  Medicare recommends these wellness visits once per year to help you and your care team stay ahead of potential health issues. These visits are designed to focus on prevention, allowing your provider to concentrate on managing your acute and chronic conditions during your regular appointments.  Please note that Annual Wellness Visits do not include a physical exam. Some assessments may be limited, especially if the visit was conducted virtually. If needed, we may recommend a separate in-person follow-up with your provider.  Wishing you excellent health and many blessings in the year to come!  -Anjelina Dung, CMA  Ongoing Care Seeing your primary care provider every 3 to 6 months helps us  monitor your health and provide consistent, personalized care.   Recommended Screenings: Please see the recommended heath screenings, labs, and/or vaccines   Health Maintenance  Topic Date Due   HIV Screening  Never done   Hepatitis C Screening  Never done   Hepatitis B Vaccine (1 of 3 - 19+ 3-dose series) Never done   HPV Vaccine (1 - 3-dose SCDM series) Never done   DTaP/Tdap/Td vaccine (2 - Tdap) 01/15/2013   COVID-19 Vaccine (3 - Moderna risk series) 12/29/2019   Flu Shot  03/14/2024   Pneumococcal Vaccine (2 of 2 - PCV) 09/24/2024*   Medicare Annual Wellness Visit  05/30/2025   Meningitis B Vaccine  Aged Out  *Topic was postponed. The date shown is not the original due date.       05/30/2024    8:11 AM  Advanced Directives  Does Patient Have a Medical Advance Directive? No  Would patient like information on creating a medical advance directive? Yes (MAU/Ambulatory/Procedural Areas - Information given)   Advance Care Planning is important because it: Ensures you  receive medical care that aligns with your values, goals, and preferences. Provides guidance to your family and loved ones, reducing the emotional burden of decision-making during critical moments.  Vision: Annual vision screenings are recommended for early detection of glaucoma, cataracts, and diabetic retinopathy. These exams can also reveal signs of chronic conditions such as diabetes and high blood pressure.  Dental: Annual dental screenings help detect early signs of oral cancer, gum disease, and other conditions linked to overall health, including heart disease and diabetes.  Please see the attached documents for additional preventive care recommendations.

## 2024-06-16 ENCOUNTER — Other Ambulatory Visit: Payer: Self-pay | Admitting: Family Medicine

## 2024-08-08 ENCOUNTER — Encounter: Payer: Self-pay | Admitting: Family Medicine

## 2024-08-17 ENCOUNTER — Other Ambulatory Visit: Payer: Self-pay | Admitting: Family Medicine

## 2024-09-04 ENCOUNTER — Other Ambulatory Visit: Payer: Self-pay | Admitting: Family Medicine

## 2024-09-04 DIAGNOSIS — K219 Gastro-esophageal reflux disease without esophagitis: Secondary | ICD-10-CM

## 2024-10-01 ENCOUNTER — Ambulatory Visit: Admitting: Family Medicine

## 2025-06-05 ENCOUNTER — Ambulatory Visit
